# Patient Record
Sex: Male | Born: 1950
Health system: Southern US, Community
[De-identification: ages and names within clinical notes are randomized; demographics above are authoritative.]

## PROBLEM LIST (undated history)

## (undated) DIAGNOSIS — I10 Essential (primary) hypertension: Secondary | ICD-10-CM

## (undated) DIAGNOSIS — H9190 Unspecified hearing loss, unspecified ear: Secondary | ICD-10-CM

## (undated) HISTORY — PX: HERNIA REPAIR: SHX51

---

## 1998-04-11 ENCOUNTER — Emergency Department (HOSPITAL_COMMUNITY): Admission: EM | Admit: 1998-04-11 | Discharge: 1998-04-11 | Payer: Self-pay | Admitting: Emergency Medicine

## 1999-10-06 ENCOUNTER — Emergency Department (HOSPITAL_COMMUNITY): Admission: EM | Admit: 1999-10-06 | Discharge: 1999-10-06 | Payer: Self-pay | Admitting: Emergency Medicine

## 1999-11-11 ENCOUNTER — Emergency Department (HOSPITAL_COMMUNITY): Admission: EM | Admit: 1999-11-11 | Discharge: 1999-11-11 | Payer: Self-pay | Admitting: *Deleted

## 1999-11-11 ENCOUNTER — Encounter: Payer: Self-pay | Admitting: Emergency Medicine

## 2000-04-30 ENCOUNTER — Emergency Department (HOSPITAL_COMMUNITY): Admission: EM | Admit: 2000-04-30 | Discharge: 2000-04-30 | Payer: Self-pay | Admitting: Emergency Medicine

## 2000-09-21 ENCOUNTER — Encounter: Payer: Self-pay | Admitting: Emergency Medicine

## 2000-09-21 ENCOUNTER — Emergency Department (HOSPITAL_COMMUNITY): Admission: EM | Admit: 2000-09-21 | Discharge: 2000-09-21 | Payer: Self-pay | Admitting: Emergency Medicine

## 2000-10-15 ENCOUNTER — Emergency Department (HOSPITAL_COMMUNITY): Admission: EM | Admit: 2000-10-15 | Discharge: 2000-10-15 | Payer: Self-pay | Admitting: Emergency Medicine

## 2000-10-22 ENCOUNTER — Emergency Department (HOSPITAL_COMMUNITY): Admission: EM | Admit: 2000-10-22 | Discharge: 2000-10-22 | Payer: Self-pay | Admitting: *Deleted

## 2000-12-02 ENCOUNTER — Other Ambulatory Visit: Admission: RE | Admit: 2000-12-02 | Discharge: 2000-12-02 | Payer: Self-pay | Admitting: Otolaryngology

## 2000-12-02 ENCOUNTER — Encounter (INDEPENDENT_AMBULATORY_CARE_PROVIDER_SITE_OTHER): Payer: Self-pay | Admitting: Specialist

## 2001-12-19 ENCOUNTER — Emergency Department (HOSPITAL_COMMUNITY): Admission: EM | Admit: 2001-12-19 | Discharge: 2001-12-19 | Payer: Self-pay | Admitting: *Deleted

## 2002-08-18 ENCOUNTER — Emergency Department (HOSPITAL_COMMUNITY): Admission: EM | Admit: 2002-08-18 | Discharge: 2002-08-18 | Payer: Self-pay | Admitting: Emergency Medicine

## 2003-05-20 ENCOUNTER — Emergency Department (HOSPITAL_COMMUNITY): Admission: EM | Admit: 2003-05-20 | Discharge: 2003-05-20 | Payer: Self-pay | Admitting: Emergency Medicine

## 2004-07-10 ENCOUNTER — Encounter: Admission: RE | Admit: 2004-07-10 | Discharge: 2004-07-10 | Payer: Self-pay | Admitting: Occupational Medicine

## 2004-11-01 ENCOUNTER — Emergency Department (HOSPITAL_COMMUNITY): Admission: EM | Admit: 2004-11-01 | Discharge: 2004-11-01 | Payer: Self-pay | Admitting: Emergency Medicine

## 2004-11-13 ENCOUNTER — Ambulatory Visit (HOSPITAL_COMMUNITY): Admission: RE | Admit: 2004-11-13 | Discharge: 2004-11-13 | Payer: Self-pay | Admitting: Urology

## 2007-01-04 ENCOUNTER — Emergency Department (HOSPITAL_COMMUNITY): Admission: EM | Admit: 2007-01-04 | Discharge: 2007-01-04 | Payer: Self-pay | Admitting: Emergency Medicine

## 2007-03-10 ENCOUNTER — Encounter: Admission: RE | Admit: 2007-03-10 | Discharge: 2007-03-10 | Payer: Self-pay | Admitting: Occupational Medicine

## 2007-03-12 ENCOUNTER — Emergency Department (HOSPITAL_COMMUNITY): Admission: EM | Admit: 2007-03-12 | Discharge: 2007-03-12 | Payer: Self-pay | Admitting: Emergency Medicine

## 2008-07-24 ENCOUNTER — Emergency Department (HOSPITAL_COMMUNITY): Admission: EM | Admit: 2008-07-24 | Discharge: 2008-07-24 | Payer: Self-pay | Admitting: Emergency Medicine

## 2008-08-10 ENCOUNTER — Emergency Department (HOSPITAL_COMMUNITY): Admission: EM | Admit: 2008-08-10 | Discharge: 2008-08-10 | Payer: Self-pay | Admitting: Emergency Medicine

## 2008-08-20 ENCOUNTER — Emergency Department (HOSPITAL_COMMUNITY): Admission: EM | Admit: 2008-08-20 | Discharge: 2008-08-20 | Payer: Self-pay | Admitting: Emergency Medicine

## 2008-08-20 ENCOUNTER — Emergency Department (HOSPITAL_COMMUNITY): Admission: EM | Admit: 2008-08-20 | Discharge: 2008-08-20 | Payer: Self-pay | Admitting: Family Medicine

## 2008-09-19 ENCOUNTER — Ambulatory Visit (HOSPITAL_COMMUNITY): Admission: RE | Admit: 2008-09-19 | Discharge: 2008-09-19 | Payer: Self-pay | Admitting: General Surgery

## 2010-03-25 ENCOUNTER — Emergency Department (HOSPITAL_COMMUNITY)
Admission: EM | Admit: 2010-03-25 | Discharge: 2010-03-25 | Payer: Self-pay | Source: Home / Self Care | Admitting: Emergency Medicine

## 2010-03-26 ENCOUNTER — Emergency Department (HOSPITAL_COMMUNITY)
Admission: EM | Admit: 2010-03-26 | Discharge: 2010-03-26 | Payer: Self-pay | Source: Home / Self Care | Admitting: Family Medicine

## 2010-06-09 LAB — DIFFERENTIAL
Eosinophils Absolute: 0.1 10*3/uL (ref 0.0–0.7)
Eosinophils Relative: 1 % (ref 0–5)
Lymphocytes Relative: 9 % — ABNORMAL LOW (ref 12–46)
Lymphs Abs: 0.9 10*3/uL (ref 0.7–4.0)
Neutrophils Relative %: 80 % — ABNORMAL HIGH (ref 43–77)

## 2010-06-09 LAB — URINE CULTURE: Culture  Setup Time: 201112280115

## 2010-06-09 LAB — POCT I-STAT, CHEM 8
BUN: 24 mg/dL — ABNORMAL HIGH (ref 6–23)
Calcium, Ion: 1.07 mmol/L — ABNORMAL LOW (ref 1.12–1.32)
Chloride: 103 mEq/L (ref 96–112)
Creatinine, Ser: 1.2 mg/dL (ref 0.4–1.5)
Glucose, Bld: 109 mg/dL — ABNORMAL HIGH (ref 70–99)
HCT: 38 % — ABNORMAL LOW (ref 39.0–52.0)
Hemoglobin: 12.9 g/dL — ABNORMAL LOW (ref 13.0–17.0)
Potassium: 4.1 mEq/L (ref 3.5–5.1)

## 2010-06-09 LAB — URINALYSIS, ROUTINE W REFLEX MICROSCOPIC
Glucose, UA: NEGATIVE mg/dL
Leukocytes, UA: NEGATIVE
pH: 5.5 (ref 5.0–8.0)

## 2010-06-09 LAB — RAPID STREP SCREEN (MED CTR MEBANE ONLY): Streptococcus, Group A Screen (Direct): NEGATIVE

## 2010-06-09 LAB — URINE MICROSCOPIC-ADD ON

## 2010-06-09 LAB — CBC
MCH: 29.3 pg (ref 26.0–34.0)
MCHC: 33.5 g/dL (ref 30.0–36.0)
RDW: 13.3 % (ref 11.5–15.5)

## 2010-06-09 LAB — PROTIME-INR: INR: 1.15 (ref 0.00–1.49)

## 2010-08-12 NOTE — Op Note (Signed)
Bryce, Kane NO.:  [id number]   MEDICAL RECORD NO.:  [id number]          PATIENT TYPE:  AMB   LOCATION:  DAY                          FACILITY:  [facility]   PHYSICIAN:  Lennie Muckle, MD      DATE OF BIRTH:  [birth year]   DATE OF PROCEDURE:  09/19/2008  DATE OF DISCHARGE:  09/19/2008                               OPERATIVE REPORT   PREOPERATIVE DIAGNOSIS:  Right inguinal hernia.  Possible nondescended  testicle.   POSTOPERATIVE DIAGNOSIS:  Right inguinal hernia.   PROCEDURE:  Right inguinal hernia repair.   SURGEON:  Lennie Muckle, MD   ASSISTANT:  OR nurse.   ANESTHESIA:  General endotracheal anesthesia.   FINDINGS:  Thick hernia sac with an indirect hernia component.   SPECIMENS:  No specimens.   BLOOD LOSS:  50 mL.   COMPLICATIONS:  No immediate complications.   INDICATIONS FOR PROCEDURE:  Bryce Kane is a 60 year old male who had had  right groin pain and discomfort.  He has been having pain since early  May.  He had had a bulging.  He did have a worsening pain with standing  and movement.  Examination was consistent with a right inguinal hernia.  He was found to only have one testicle on examination.  He was tried on  ibuprofen and warm compresses, continued to have pain.  He therefore  obtained consent for a right inguinal hernia, possible orchiectomy given  the fact that the mass was palpated in the right groin which I could not  ascertain whether it was the testicle or the hernia.  Informed consent.  I had talked to him about the risk of the surgery including possible  orchiectomy.  He agreed to the procedure.   DETAILS OF PROCEDURE:  [name] was identified in the preoperative  holding area.  He received 2 g of Kefzol and was taken to the operating  room.  Once in the operating room, placed in supine position.  He  received a gram of Kefzol.  Once in the operating room, placed in supine  position.  After administration of general  endotracheal anesthesia, his  right groin was clipped, prepped and draped in the usual sterile  fashion.  A time-out procedure indicating the patient and procedure were  performed.  Using anatomic landmarks of the pubic tubercle and the right  anterior iliac spine, I placed an incision in the vicinity of the  ilioinguinal ligament.  Scarpa fascia was divided with electrocautery.  I came down to the external oblique muscle, the fascia.  I incised this  with 15 blade and extended the incision with Metzenbaum scissors.  I  carried the incision down to the external ring.  I opened the external  ring.  Continued superiorly to open up the external oblique fascia.  I  then dissected the hernia sac and spermatic cord and pubic tubercle.  A  Penrose drain was placed around this specimen.  I then dissected the  cremasteric muscle away from the hernia sac hernia and the spermatic  cord and vessels.  I was able  to dissect the hernia sac away from the  spermatic cord and vessels superiorly.  This was very dense and thick  and likely chronic in nature.  I dissected this far up into the internal  ring.  I performed a high ligation with a 2-0 silk suture.  This was  allowed to retract back into the abdominal cavity.  I then closed some  of the internal ring with a 2-0 Prolene suture.  Minimal amount of  oozing in this area.  I placed another stitch and the oozing was  controlled.  I then placed a 3 x 6 piece of Ultrapro mesh into the  operative field. Trimmed this down to size.  This was sewn with a 2-0  Prolene in a running fashion at the pubic tubercle at the shelving ledge  of the ilioinguinal ligament and at the transversalis and internal  oblique musculature.  I overlapped the tails around the spermatic cord  to create a new internal ring.  I tucked this under the external oblique  fascia.  I closed down the internal ring until only a pinky finger could  fit into the space between the spermatic cord  and the mesh.  I then  irrigated the wound bed.  There was no evidence of bleeding.  There was  a good closure of the mesh.  I then reapproximated the external oblique  fascia with a 3-0 Vicryl in a running fashion.  Scarpa's was  reapproximated.  The skin was closed with 4-0 Monocryl.  Steri-Strips  was placed as final dressing.  The patient was then extubated and  transported to post anesthesia care unit in stable condition. The  testicle was palpated in the scrotum.  He will be discharged home with  Percocet #50 to take over the counter stool softener of choice and  follow up with me in approximately 2 to 3 weeks.      Lennie Muckle, MD  Electronically Signed     ALA/MEDQ  D:  09/19/2008  T:  09/20/2008  Job:  [id number]

## 2011-05-14 ENCOUNTER — Emergency Department (HOSPITAL_COMMUNITY)
Admission: EM | Admit: 2011-05-14 | Discharge: 2011-05-14 | Disposition: A | Discharge status: Home / Self Care | Payer: BC Managed Care – PPO | Attending: Emergency Medicine | Admitting: Emergency Medicine

## 2011-05-14 DIAGNOSIS — K6289 Other specified diseases of anus and rectum: Secondary | ICD-10-CM | POA: Insufficient documentation

## 2011-05-14 DIAGNOSIS — L290 Pruritus ani: Secondary | ICD-10-CM | POA: Insufficient documentation

## 2011-05-14 DIAGNOSIS — K5900 Constipation, unspecified: Secondary | ICD-10-CM | POA: Insufficient documentation

## 2011-05-14 DIAGNOSIS — K644 Residual hemorrhoidal skin tags: Secondary | ICD-10-CM | POA: Insufficient documentation

## 2011-05-14 DIAGNOSIS — K648 Other hemorrhoids: Secondary | ICD-10-CM | POA: Insufficient documentation

## 2011-05-14 MED ORDER — DOCUSATE SODIUM 100 MG PO CAPS: 100.0000 mg | ORAL_CAPSULE | Freq: Two times a day (BID) | ORAL | Status: AC

## 2011-05-14 MED ORDER — POLYETHYLENE GLYCOL 3350 17 GM/SCOOP PO POWD: 17.0000 g | Freq: Every day | ORAL | Status: AC

## 2011-05-14 MED ORDER — HYDROCORTISONE ACETATE 25 MG RE SUPP: 25.0000 mg | Freq: Two times a day (BID) | RECTAL | Status: AC

## 2011-05-14 NOTE — Discharge Instructions (Signed)
Please read and follow all provided instructions.  Your diagnoses today include:  1. Hemorrhoids     Tests performed today include:  Vital signs. See below for your results today.   Medications prescribed:   Miralax - oral laxative  Colace - stool softener  Anusol - steroid to help with itching and pain  Take any prescribed medications only as directed. You have been prescribed pain medicine that can make you drowsy. Do not drive or perform any activities that require you to be awake and alert if taking this medication.   Follow-up instructions: Please follow-up with your primary care provider in the next 5 days for further evaluation of your symptoms. If you do not have a primary care doctor -- see below for referral information.   Return instructions:   Please return to the Emergency Department if you experience worsening symptoms.   Please return if you have any other concerns.  Additional Information:  Your vital signs today were: BP 152/93  Pulse 71  Temp(Src) 98.5 F (36.9 C) (Oral)  Resp 18  Wt 180 lb (81.647 kg)  SpO2 100% If your blood pressure (BP) was elevated above 135/85 this visit, please have this repeated by your doctor within one month. -------------- No Primary Care Doctor Call [facility]  [phone] Other agencies that provide inexpensive medical care    [facility]  [phone]    [facility]  [phone]    [facility]  [phone]    [facility]  [phone]    [facility]  [phone]    [facility]  [phone] -------------- RESOURCE GUIDE:  Dental Problems  Patients with Medicaid: [facility] [phone] [address]                                            [phone] [address] Phone:  [phone]                                                   Phone:  [phone]  If unable to pay or uninsured, contact:  [facility] or [facility]. to become qualified for the adult dental clinic.  Chronic Pain Problems Contact [facility]  [phone] Patients need to be referred by their primary care doctor.  Insufficient Money for Medicine Contact [employer]:  call "211" or [facility] [phone].  Psychological Services [facility]  [phone] [facility]  [phone] [facility]   [phone] (emergency services [phone])  Substance Abuse Resources [facility]  [phone] [facility] [phone] [facility] [place] [phone] Floydene Flock [phone] Residential & Outpatient Substance Abuse Program  [phone]  Abuse/Neglect [facility] Child Abuse Hotline [phone] [facility] Child Abuse Hotline [phone] (After Hours)  Emergency Shelter [facility] [phone]  Maternity Homes Room at the [place] [facility] [place] [phone] [facility] [phone]  [facility] [place]  [facility] [address] [place]                       [address]      [address]  [place]                                                [place]                            [place] Phone:  [phone]                                   Phone:  [phone]                 Phone:  [phone]  [facility] Phone:  [phone]  [place] Child Abuse Hotline [phone] (After Hours)    

## 2011-05-14 NOTE — ED Notes (Signed)
Pt alert, nad, c/o hemorrhoids, onset chronic in nature, states constipation, rectal itching after bowel movement, denies bloody stool, resp even unlabored, skin pwd

## 2011-05-14 NOTE — ED Provider Notes (Signed)
Medical screening examination/treatment/procedure(s) were performed by non-physician practitioner and as supervising physician I was immediately available for consultation/collaboration.  Amore Ackman K Lael Wetherbee-Rasch, MD 05/14/11 0741 

## 2011-05-14 NOTE — ED Notes (Addendum)
Pt reports having a hemorrhoid, chronic, itchy and pain at bowel movement. ABC intact. nad noted. Denied using any creams, sts the pain usually goes away on its own, denied any bleeding

## 2011-05-14 NOTE — ED Provider Notes (Signed)
History     CSN: [id number]  Arrival date & time 05/14/11  0447   First MD Initiated Contact with Patient 05/14/11 0602      Chief Complaint  Patient presents with  . Hemorrhoids    (Consider location/radiation/quality/duration/timing/severity/associated sxs/prior treatment) HPI Comments: Patient with history of hemorrhoids complaining of worsening pain with defecation and itching over the past several days. He has had previous episodes of similar symptoms "for a long time". Patient has had bright red blood noted in the toilet bowl in the past however this is not currently happening. Patient states that he has not had a bowel movement in several days. Patient states this is making his abdomen hurts. He denies nausea, vomiting, diarrhea. He denies urinary problems. He states that he has tried Preparation H in the past however this has not helped.  The history is provided by the patient.    No past medical history on file.  No past surgical history on file.  No family history on file.  History  Substance Use Topics  . Smoking status: Not on file  . Smokeless tobacco: Not on file  . Alcohol Use: Not on file      Review of Systems  Constitutional: Negative for fever.  HENT: Negative for sore throat and rhinorrhea.   Eyes: Negative for redness.  Respiratory: Negative for cough.   Cardiovascular: Negative for chest pain.  Gastrointestinal: Positive for abdominal pain, constipation and rectal pain. Negative for nausea, vomiting, diarrhea and blood in stool.  Genitourinary: Negative for dysuria.  Musculoskeletal: Negative for myalgias.  Skin: Negative for rash.  Neurological: Negative for headaches.    Allergies  Review of patient's allergies indicates no known allergies.  Home Medications  No current outpatient prescriptions on file.  BP 152/93  Pulse 71  Temp(Src) 98.5 F (36.9 C) (Oral)  Resp 18  Wt 180 lb (81.647 kg)  SpO2 100%  Physical Exam  Nursing note  and vitals reviewed. Constitutional: He is oriented to person, place, and time. He appears well-developed and well-nourished.  HENT:  Head: Normocephalic and atraumatic.  Eyes: Conjunctivae are normal. Right eye exhibits no discharge. Left eye exhibits no discharge.  Neck: Normal range of motion. Neck supple.  Cardiovascular: Normal rate, regular rhythm and normal heart sounds.   Pulmonary/Chest: Effort normal and breath sounds normal.  Abdominal: Soft. Bowel sounds are normal. There is no tenderness. There is no rebound and no guarding.  Genitourinary: Rectal exam shows external hemorrhoid and internal hemorrhoid. Rectal exam shows no mass, no tenderness and anal tone normal.  Neurological: He is alert and oriented to person, place, and time.  Skin: Skin is warm and dry.  Psychiatric: He has a normal mood and affect.    ED Course  Procedures (including critical care time)  Labs Reviewed - No data to display No results found.   1. Hemorrhoids   2. Pruritus ani     7:16 AM Patient seen and examined.   Vital signs reviewed and are as follows: Filed Vitals:   05/14/11 0652  BP: 139/63  Pulse: 63  Temp:   Resp: 16   Counseled on treatment for constipation, rectal pain, hemorrhoids. The patient was urged to return to the Emergency Department immediately with worsening of current symptoms, worsening abdominal pain, persistent vomiting, blood noted in stools, fever, or any other concerns. The patient verbalized understanding.   MDM  Patient with ext/int hemorrhoids on exam. Main complaint is constipation, rectal pain, and itching. No concerning abdominal  pain on exam. Abdomen is soft, NT. Vitals are stable, no fever. No signs of dehydration. No focal abdominal pain, no concern for appendicitis, cholecystitis, pancreatitis, ruptured viscus, UTI, kidney stone, or any other abdominal etiology.  Supportive therapy indicated with return if symptoms worsen.  Patient  counseled.         Eustace Moore [place], [place] 05/14/11 [phone]

## 2011-05-14 NOTE — ED Notes (Signed)
Pt HOH.

## 2014-10-30 ENCOUNTER — Emergency Department (HOSPITAL_COMMUNITY): Payer: Medicare Other

## 2014-10-30 ENCOUNTER — Emergency Department (HOSPITAL_COMMUNITY)
Admission: EM | Admit: 2014-10-30 | Discharge: 2014-10-31 | Disposition: A | Discharge status: Home / Self Care | Payer: Medicare Other | Attending: Emergency Medicine | Admitting: Emergency Medicine

## 2014-10-30 ENCOUNTER — Encounter (HOSPITAL_COMMUNITY): Payer: Self-pay | Admitting: *Deleted

## 2014-10-30 DIAGNOSIS — Z79899 Other long term (current) drug therapy: Secondary | ICD-10-CM | POA: Diagnosis not present

## 2014-10-30 DIAGNOSIS — R1013 Epigastric pain: Secondary | ICD-10-CM | POA: Diagnosis present

## 2014-10-30 DIAGNOSIS — Z9889 Other specified postprocedural states: Secondary | ICD-10-CM | POA: Diagnosis not present

## 2014-10-30 DIAGNOSIS — I10 Essential (primary) hypertension: Secondary | ICD-10-CM | POA: Insufficient documentation

## 2014-10-30 DIAGNOSIS — M545 Low back pain: Secondary | ICD-10-CM | POA: Insufficient documentation

## 2014-10-30 HISTORY — DX: Essential (primary) hypertension: I10

## 2014-10-30 LAB — COMPREHENSIVE METABOLIC PANEL
ALT: 22 U/L (ref 17–63)
ANION GAP: 6 (ref 5–15)
AST: 26 U/L (ref 15–41)
Albumin: 4 g/dL (ref 3.5–5.0)
Alkaline Phosphatase: 45 U/L (ref 38–126)
BUN: 19 mg/dL (ref 6–20)
CALCIUM: 8.9 mg/dL (ref 8.9–10.3)
CO2: 27 mmol/L (ref 22–32)
CREATININE: 1.12 mg/dL (ref 0.61–1.24)
Chloride: 109 mmol/L (ref 101–111)
GFR calc Af Amer: >60 mL/min (ref >60)
GFR calc non Af Amer: >60 mL/min (ref >60)
Glucose, Bld: 137 mg/dL — ABNORMAL HIGH (ref 65–99)
POTASSIUM: 3.3 mmol/L — AB (ref 3.5–5.1)
SODIUM: 142 mmol/L (ref 135–145)
Total Bilirubin: 0.6 mg/dL (ref 0.3–1.2)
Total Protein: 7.1 g/dL (ref 6.5–8.1)

## 2014-10-30 LAB — URINALYSIS, ROUTINE W REFLEX MICROSCOPIC
Bilirubin Urine: NEGATIVE
Glucose, UA: NEGATIVE mg/dL
HGB URINE DIPSTICK: NEGATIVE
KETONES UR: NEGATIVE mg/dL
LEUKOCYTES UA: NEGATIVE
Nitrite: NEGATIVE
PH: 5.5 (ref 5.0–8.0)
Protein, ur: NEGATIVE mg/dL
SPECIFIC GRAVITY, URINE: 1.025 (ref 1.005–1.030)
UROBILINOGEN UA: 0.2 mg/dL (ref 0.0–1.0)

## 2014-10-30 LAB — CBC
HEMATOCRIT: 38.9 % — AB (ref 39.0–52.0)
Hemoglobin: 12.8 g/dL — ABNORMAL LOW (ref 13.0–17.0)
MCH: 28.8 pg (ref 26.0–34.0)
MCHC: 32.9 g/dL (ref 30.0–36.0)
MCV: 87.6 fL (ref 78.0–100.0)
Platelets: 190 10*3/uL (ref 150–400)
RBC: 4.44 MIL/uL (ref 4.22–5.81)
RDW: 13.1 % (ref 11.5–15.5)
WBC: 5 10*3/uL (ref 4.0–10.5)

## 2014-10-30 LAB — LIPASE, BLOOD: Lipase: 21 U/L — ABNORMAL LOW (ref 22–51)

## 2014-10-30 MED ORDER — MORPHINE SULFATE 4 MG/ML IJ SOLN
4.0000 mg | Freq: Once | INTRAMUSCULAR | Status: AC
  Administered 2014-10-30: 4 mg via INTRAVENOUS
  Filled 2014-10-30: qty 1

## 2014-10-30 MED ORDER — ONDANSETRON HCL 4 MG/2ML IJ SOLN
4.0000 mg | Freq: Once | INTRAMUSCULAR | Status: AC
  Administered 2014-10-30: 4 mg via INTRAVENOUS
  Filled 2014-10-30: qty 2

## 2014-10-30 MED ORDER — SODIUM CHLORIDE 0.9 % IV BOLUS (SEPSIS)
1000.0000 mL | Freq: Once | INTRAVENOUS | Status: AC
  Administered 2014-10-30: 1000 mL via INTRAVENOUS

## 2014-10-30 MED ORDER — POTASSIUM CHLORIDE CRYS ER 20 MEQ PO TBCR
40.0000 meq | EXTENDED_RELEASE_TABLET | Freq: Once | ORAL | Status: AC
  Administered 2014-10-30: 40 meq via ORAL
  Filled 2014-10-30: qty 2

## 2014-10-30 MED ORDER — IOHEXOL 300 MG/ML  SOLN
100.0000 mL | Freq: Once | INTRAMUSCULAR | Status: AC | PRN
  Administered 2014-10-30: 100 mL via INTRAVENOUS

## 2014-10-30 NOTE — ED Notes (Signed)
Pt c/o middle and upper abdominal pain after eating x several weeks.  Pt denies nausea, vomiting, diarrhea.  Pt states he feels like he has "knot" in stomach after eating.  Pt also c/o lower back pain with difficulty bending over x 4days.

## 2014-10-30 NOTE — ED Notes (Signed)
PT states that he has had back pain since Fri that has gotten progressively worse; pt states that hurts to lean over or move; pt denies injury to back; pt also c/o abd pain; pt states that the abd pain in intermittent  And worse after he eats; pt states that it feels like a knot comes up in his stomach

## 2014-10-30 NOTE — ED Provider Notes (Signed)
CSN: [id number]     Arrival date & time 10/30/14  2008 History   First MD Initiated Contact with Patient 10/30/14 2122     Chief Complaint  Patient presents with  . Back Pain  . Abdominal Pain     (Consider location/radiation/quality/duration/timing/severity/associated sxs/prior Treatment) Patient is a 64 y.o. male presenting with abdominal pain. The history is provided by the patient and the spouse.  Abdominal Pain Pain location:  Epigastric Pain quality: sharp and shooting   Pain radiates to:  Does not radiate Pain severity:  Moderate Onset quality:  Sudden Duration:  2 days Timing:  Constant Progression:  Unchanged Chronicity:  New Relieved by:  Nothing Worsened by:  Nothing tried Ineffective treatments:  None tried Associated symptoms: no chest pain, no chills, no diarrhea, no fever, no nausea, no shortness of breath and no vomiting    64 yo M with a chief complaint of abdominal pain and back pain. This been going on for the past 4 days. Patient states the pain is much worse after he eats it's just above his umbilicus. Patient has got nauseated with it a couple times but has not vomited. Patient also is given complaining of lower back pain. Patient is unsure if this is related to his abdominal pain. Patient denies fevers or chills denies diarrhea.   Past Medical History  Diagnosis Date  . Hypertension    Past Surgical History  Procedure Laterality Date  . Hernia repair     No family history on file. History  Substance Use Topics  . Smoking status: Never Smoker   . Smokeless tobacco: Not on file  . Alcohol Use: No    Review of Systems  Constitutional: Negative for fever and chills.  HENT: Negative for congestion and facial swelling.   Eyes: Negative for discharge and visual disturbance.  Respiratory: Negative for shortness of breath.   Cardiovascular: Negative for chest pain and palpitations.  Gastrointestinal: Positive for abdominal pain. Negative for nausea,  vomiting and diarrhea.  Musculoskeletal: Positive for back pain. Negative for myalgias and arthralgias.  Skin: Negative for color change and rash.  Neurological: Negative for tremors, syncope and headaches.  Psychiatric/Behavioral: Negative for confusion and dysphoric mood.      Allergies  Review of patient's allergies indicates no known allergies.  Home Medications   Prior to Admission medications   Medication Sig Start Date End Date Taking? Authorizing Provider  ibuprofen (ADVIL,MOTRIN) 200 MG tablet Take 200 mg by mouth every 6 (six) hours as needed for moderate pain.   Yes Historical Provider, MD  valsartan-hydrochlorothiazide (DIOVAN-HCT) 320-12.5 MG per tablet Take 1 tablet by mouth every morning. 10/05/14  Yes Historical Provider, MD   BP 131/93 mmHg  Pulse 94  Temp(Src) 98.2 F (36.8 C) (Oral)  Resp 18  Wt 175 lb (79.379 kg)  SpO2 99% Physical Exam  Constitutional: He is oriented to person, place, and time. He appears well-developed and well-nourished.  HENT:  Head: Normocephalic and atraumatic.  Eyes: EOM are normal. Pupils are equal, round, and reactive to light.  Neck: Normal range of motion. Neck supple. No JVD present.  Cardiovascular: Normal rate and regular rhythm.  Exam reveals no gallop and no friction rub.   No murmur heard. Pulmonary/Chest: No respiratory distress. He has no wheezes.  Abdominal: He exhibits no distension. There is no tenderness. There is no rebound and no guarding.  Benign abdominal exam.  Musculoskeletal: Normal range of motion. He exhibits no edema or tenderness.  Patient points to  levels of iliac crest across his back. States that it hurts in that area however no pain on palpation.  Neurological: He is alert and oriented to person, place, and time. He displays no Babinski's sign on the right side. He displays no Babinski's sign on the left side.  Reflex Scores:      Patellar reflexes are 2+ on the right side and 2+ on the left side.       Achilles reflexes are 2+ on the right side and 2+ on the left side. Ambulates without difficulty.  Skin: No rash noted. No pallor.  Psychiatric: He has a normal mood and affect. His behavior is normal.    ED Course  Procedures (including critical care time) Labs Review Labs Reviewed  CBC - Abnormal; Notable for the following:    Hemoglobin 12.8 (*)    HCT 38.9 (*)    All other components within normal limits  LIPASE, BLOOD  COMPREHENSIVE METABOLIC PANEL  URINALYSIS, ROUTINE W REFLEX MICROSCOPIC (NOT AT [facility])    Imaging Review No results found.   EKG Interpretation None      MDM   Final diagnoses:  None    65 yo M with a chief complaint of abdominal pain and back pain. Benign abdominal and back exam. Patient says the pain is colicky. Will obtain a CT scan of the abdomen and pelvis to eval for AAA, vs bowel pathology.  CT scan negative and images viewed by me. UA negative for infection. Patient with very mild hypokalemia. Treated here in the ED.  12:24 AM:  I have discussed the diagnosis/risks/treatment options with the patient and family and believe the pt to be eligible for discharge home to follow-up with PCP. We also discussed returning to the ED immediately if new or worsening sx occur. We discussed the sx which are most concerning (e.g., sudden worsening pain.) that necessitate immediate return. Medications administered to the patient during their visit and any new prescriptions provided to the patient are listed below.  Medications given during this visit Medications  morphine 4 MG/ML injection 4 mg (4 mg Intravenous Given 10/30/14 2308)  ondansetron (ZOFRAN) injection 4 mg (4 mg Intravenous Given 10/30/14 2308)  sodium chloride 0.9 % bolus 1,000 mL (1,000 mLs Intravenous New Bag/Given 10/30/14 2307)  potassium chloride SA (K-DUR,KLOR-CON) CR tablet 40 mEq (40 mEq Oral Given 10/30/14 2307)  iohexol (OMNIPAQUE) 300 MG/ML solution 100 mL (100 mLs Intravenous Contrast Given  10/30/14 2330)    New Prescriptions   No medications on file     The patient appears reasonably screen and/or stabilized for discharge and I doubt any other medical condition or other [facility] requiring further screening, evaluation, or treatment in the ED at this time prior to discharge.     Melene Plan, DO 10/31/14 0025

## 2014-10-31 NOTE — Discharge Instructions (Signed)

## 2016-11-22 ENCOUNTER — Emergency Department (HOSPITAL_COMMUNITY): Payer: Medicare Other

## 2016-11-22 ENCOUNTER — Encounter (HOSPITAL_COMMUNITY): Payer: Self-pay | Admitting: Emergency Medicine

## 2016-11-22 ENCOUNTER — Emergency Department (HOSPITAL_COMMUNITY)
Admission: EM | Admit: 2016-11-22 | Discharge: 2016-11-22 | Disposition: A | Discharge status: Home / Self Care | Payer: Medicare Other | Attending: Emergency Medicine | Admitting: Emergency Medicine

## 2016-11-22 DIAGNOSIS — Z79899 Other long term (current) drug therapy: Secondary | ICD-10-CM | POA: Diagnosis not present

## 2016-11-22 DIAGNOSIS — S66912A Strain of unspecified muscle, fascia and tendon at wrist and hand level, left hand, initial encounter: Secondary | ICD-10-CM | POA: Diagnosis not present

## 2016-11-22 DIAGNOSIS — M25542 Pain in joints of left hand: Secondary | ICD-10-CM | POA: Diagnosis present

## 2016-11-22 DIAGNOSIS — X503XXA Overexertion from repetitive movements, initial encounter: Secondary | ICD-10-CM | POA: Diagnosis not present

## 2016-11-22 DIAGNOSIS — Y939 Activity, unspecified: Secondary | ICD-10-CM | POA: Insufficient documentation

## 2016-11-22 DIAGNOSIS — M1812 Unilateral primary osteoarthritis of first carpometacarpal joint, left hand: Secondary | ICD-10-CM | POA: Insufficient documentation

## 2016-11-22 DIAGNOSIS — Y929 Unspecified place or not applicable: Secondary | ICD-10-CM | POA: Insufficient documentation

## 2016-11-22 DIAGNOSIS — Y999 Unspecified external cause status: Secondary | ICD-10-CM | POA: Insufficient documentation

## 2016-11-22 DIAGNOSIS — M79642 Pain in left hand: Secondary | ICD-10-CM

## 2016-11-22 DIAGNOSIS — T148XXA Other injury of unspecified body region, initial encounter: Secondary | ICD-10-CM

## 2016-11-22 MED ORDER — ACETAMINOPHEN 325 MG PO TABS
650.0000 mg | ORAL_TABLET | Freq: Once | ORAL | Status: AC
  Administered 2016-11-22: 650 mg via ORAL
  Filled 2016-11-22: qty 2

## 2016-11-22 NOTE — ED Triage Notes (Signed)
Pt c/o chronic L hand pain. Pt states pain in L hand and in L knuckles between 2nd and 3rd fingers. 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

## 2016-11-22 NOTE — ED Provider Notes (Signed)
[facility]-EMERGENCY DEPT Provider Note   CSN: [id number] Arrival date & time: 11/22/16  1301     History   Chief Complaint Chief Complaint  Patient presents with  . Hand Pain    HPI Bryce Kane is a 66 y.o. male with a PMHx of HTN, who presents to the ED with complaints of chronic left hand pain. Patient states that he has worked in a paper company for 31 years, and does a lot of heavy lifting at work. States that he always has bilateral thumb pain because of this heavy lifting, however last week he started having some left hand pain second-third MCP joint area. He describes the pain as 6/10 at onset although not currently ongoing, and it's intermittent nonradiating sharp and sore left hand pain that usually occurs when he is working or using his hands, with no tx tried PTA and no known alleviating factors aside from not using his hand. He noticed some swelling as well. He denies any bruising, erythema, warmth, fever, numbness, tingling, focal weakness, or any other complaints at this time. He has never seen anybody about his hand pain.   The history is provided by the patient and medical records. No language interpreter was used.  Hand Pain  This is a chronic problem. The current episode started more than 2 days ago. The problem occurs daily. The problem has not changed since onset.Exacerbated by: use of hands. Nothing relieves the symptoms. He has tried nothing for the symptoms. The treatment provided no relief.    Past Medical History:  Diagnosis Date  . Hypertension     There are no active problems to display for this patient.   Past Surgical History:  Procedure Laterality Date  . HERNIA REPAIR         Home Medications    Prior to Admission medications   Medication Sig Start Date End Date Taking? Authorizing Provider  ibuprofen (ADVIL,MOTRIN) 200 MG tablet Take 200 mg by mouth every 6 (six) hours as needed for moderate pain.    [provider]    valsartan-hydrochlorothiazide (DIOVAN-HCT) 320-12.5 MG per tablet Take 1 tablet by mouth every morning. 10/05/14   [provider]    Family History No family history on file.  Social History Social History  Substance Use Topics  . Smoking status: Never Smoker  . Smokeless tobacco: Not on file  . Alcohol use No     Allergies   Patient has no known allergies.   Review of Systems Review of Systems  Constitutional: Negative for chills and fever.  Musculoskeletal: Positive for arthralgias and joint swelling.  Skin: Negative for color change.  Allergic/Immunologic: Negative for immunocompromised state.  Neurological: Negative for weakness and numbness.  Psychiatric/Behavioral: Negative for confusion.     Physical Exam Updated Vital Signs BP (!) 145/93 (BP Location: Right Arm)   Pulse 93   Temp 98.7 F (37.1 C) (Oral)   Resp 18   SpO2 99%   Physical Exam  Constitutional: He is oriented to person, place, and time. Vital signs are normal. He appears well-developed and well-nourished.  Non-toxic appearance. No distress.  Afebrile, nontoxic, NAD  HENT:  Head: Normocephalic and atraumatic.  Mouth/Throat: Mucous membranes are normal.  Eyes: Conjunctivae and EOM are normal. Right eye exhibits no discharge. Left eye exhibits no discharge.  Neck: Normal range of motion. Neck supple.  Cardiovascular: Normal rate and intact distal pulses.   Pulmonary/Chest: Effort normal. No respiratory distress.  Abdominal: Normal appearance. He exhibits no distension.  Musculoskeletal: Normal range of motion.       Left hand: He exhibits tenderness and swelling. He exhibits normal range of motion, normal capillary refill, no deformity and no laceration. Normal sensation noted. Normal strength noted.       Hands: L hand with FROM intact in all digits, with very mild swelling over the 2nd/3rd MCP joint area and the space between the joints, no erythema or warmth, no bruising, mild TTP to  this area as well as to the 1st [facility] joint, no crepitus or deformities, Strength and sensation grossly intact, distal pulses intact, compartments soft.   Neurological: He is alert and oriented to person, place, and time. He has normal strength. No sensory deficit.  Skin: Skin is warm, dry and intact. No rash noted.  Psychiatric: He has a normal mood and affect.  Nursing note and vitals reviewed.    ED Treatments / Results  Labs (all labs ordered are listed, but only abnormal results are displayed) Labs Reviewed - No data to display  EKG  EKG Interpretation None       Radiology Dg Hand Complete Left  Result Date: 11/22/2016 CLINICAL DATA:  Left hand pain, no known injury, initial encounter EXAM: LEFT HAND - COMPLETE 3+ VIEW COMPARISON:  None. FINDINGS: Mild degenerative changes are noted at the first [facility] joint. No acute fracture or dislocation is noted. No soft tissue abnormality is seen. IMPRESSION: No acute abnormality noted. Electronically Signed   By: Alcide Clever M.D.   On: 11/22/2016 13:52    Procedures Procedures (including critical care time)  Medications Ordered in ED Medications  acetaminophen (TYLENOL) tablet 650 mg (650 mg Oral Given 11/22/16 1410)     Initial Impression / Assessment and Plan / ED Course  I have reviewed the triage vital signs and the nursing notes.  Pertinent labs & imaging results that were available during my care of the patient were reviewed by me and considered in my medical decision making (see chart for details).     66 y.o. male here with L hand pain, somewhat chronic in nature however last week started having pain in the 2nd/3rd MCP joint area. No trauma or injury. On exam, mild swelling overlying the 2nd/3rd MCP area, mild TTP to this area, no crepitus or deformity, no overlying skin changes, no warmth, NVI with soft compartments, FROM intact. Will give tylenol, xray already in process which is fine since this will give [place] a chance to see  if he has a stress fx or arthritis in the joint. Will reassess shortly.   2:30 PM Xray negative aside from some mild degenerative changes at 1st [facility] joint. Likely overuse tendinitis/strain compounded by arthritis. Ace wrap applied for comfort/compression. Advised RICE, tylenol/motrin for pain, and f/up with hand specialist in 1-2wks for recheck of symptoms. I explained the diagnosis and have given explicit precautions to return to the ER including for any other new or worsening symptoms. The patient understands and accepts the medical plan as it's been dictated and I have answered their questions. Discharge instructions concerning home care and prescriptions have been given. The patient is STABLE and is discharged to home in good condition.    Final Clinical Impressions(s) / ED Diagnoses   Final diagnoses:  Left hand pain  Repetitive strain injury  Osteoarthritis of carpometacarpal joint of left thumb, unspecified osteoarthritis type    New Prescriptions New Prescriptions   No medications on [address], [place], [place] 11/22/16 1431  Marily Memos, MD 11/23/16 1034

## 2016-11-22 NOTE — Discharge Instructions (Signed)
Wear ace wrap as needed for comfort. Ice and elevate hand throughout the day, using ice pack for no more than 20 minutes every hour.  Alternate between tylenol and motrin as needed for pain relief. Call hand specialist follow up today or tomorrow to schedule followup appointment for recheck of symptoms in 1-2 weeks. Return to the ER for changes or worsening symptoms.

## 2017-12-28 ENCOUNTER — Other Ambulatory Visit: Payer: Self-pay

## 2017-12-28 ENCOUNTER — Emergency Department (HOSPITAL_COMMUNITY)
Admission: EM | Admit: 2017-12-28 | Discharge: 2017-12-28 | Disposition: A | Discharge status: Home / Self Care | Payer: Medicare Other | Attending: Emergency Medicine | Admitting: Emergency Medicine

## 2017-12-28 ENCOUNTER — Encounter (HOSPITAL_COMMUNITY): Payer: Self-pay

## 2017-12-28 DIAGNOSIS — Z79899 Other long term (current) drug therapy: Secondary | ICD-10-CM | POA: Insufficient documentation

## 2017-12-28 DIAGNOSIS — I10 Essential (primary) hypertension: Secondary | ICD-10-CM

## 2017-12-28 DIAGNOSIS — Z760 Encounter for issue of repeat prescription: Secondary | ICD-10-CM | POA: Diagnosis not present

## 2017-12-28 LAB — BASIC METABOLIC PANEL
Anion gap: 9 (ref 5–15)
BUN: 17 mg/dL (ref 8–23)
CALCIUM: 9.3 mg/dL (ref 8.9–10.3)
CO2: 27 mmol/L (ref 22–32)
CREATININE: 1.02 mg/dL (ref 0.61–1.24)
Chloride: 110 mmol/L (ref 98–111)
GFR calc non Af Amer: >60 mL/min (ref >60)
Glucose, Bld: 103 mg/dL — ABNORMAL HIGH (ref 70–99)
Potassium: 4.2 mmol/L (ref 3.5–5.1)
SODIUM: 146 mmol/L — AB (ref 135–145)

## 2017-12-28 MED ORDER — VALSARTAN-HYDROCHLOROTHIAZIDE 320-12.5 MG PO TABS: 1.0000 | ORAL_TABLET | Freq: Every morning | ORAL | 12 refills | Status: DC

## 2017-12-28 NOTE — Discharge Instructions (Addendum)
It is important to establish care with a primary care provider for further management of your blood pressure.  From given for a primary care provider today while in the ER, contact office tomorrow and begin to work on plan for follow-up arrangements.

## 2017-12-28 NOTE — ED Triage Notes (Signed)
Patient reports that his "Doctor went out of business." and he ran out of Valsartan-HCTZ 320/12.5. Patient states he skipped doses so he could make them last.

## 2017-12-28 NOTE — ED Provider Notes (Signed)
[facility]-EMERGENCY DEPT Provider Note   CSN: [id number] Arrival date & time: 12/28/17  1800     History   Chief Complaint Chief Complaint  Patient presents with  . Medication Refill    HPI Bryce Kane is a 67 y.o. male.  67 year old male presents with request for refill of his blood pressure medication.  Patient states that his doctor retired or went out of business and he is been unable to be seen for a refill.  Patient has a bottle of his blood pressure medication with him, last filled on April 2018, patient states that this is an older bottle, he had other bottles at home but he is Artie taking all the medication.  Patient has 1 pill left in the bottle, states that he has been taking it in half as it is a large pill to try and swallow.  Patient denies complaints of vision changes, headache, chest pain.  No other complaints or concerns today.     Past Medical History:  Diagnosis Date  . Hypertension     There are no active problems to display for this patient.   Past Surgical History:  Procedure Laterality Date  . HERNIA REPAIR          Home Medications    Prior to Admission medications   Medication Sig Start Date End Date Taking? Authorizing Provider  ibuprofen (ADVIL,MOTRIN) 200 MG tablet Take 200 mg by mouth every 6 (six) hours as needed for moderate pain.    [provider]  valsartan-hydrochlorothiazide (DIOVAN-HCT) 320-12.5 MG tablet Take 1 tablet by mouth every morning. 12/28/17   Jeannie Fend, PA-C    Family History History reviewed. No pertinent family history.  Social History Social History   Tobacco Use  . Smoking status: Never Smoker  . Smokeless tobacco: Never Used  Substance Use Topics  . Alcohol use: No  . Drug use: No     Allergies   Patient has no known allergies.   Review of Systems Review of Systems  Eyes: Negative for visual disturbance.  Respiratory: Negative for chest tightness and  shortness of breath.   Cardiovascular: Negative for chest pain and leg swelling.  Gastrointestinal: Negative for nausea and vomiting.  Allergic/Immunologic: Negative for immunocompromised state.  Neurological: Negative for dizziness and headaches.  Psychiatric/Behavioral: Negative for confusion.  All other systems reviewed and are negative.    Physical Exam Updated Vital Signs BP (!) 159/108   Pulse 65   Temp 99.2 F (37.3 C) (Oral)   Resp 16   Ht 5\' 10"  (1.778 m)   Wt 90.7 kg   SpO2 100%   BMI 28.70 kg/m   Physical Exam  Constitutional: He is oriented to person, place, and time. He appears well-developed and well-nourished. No distress.  HENT:  Head: Normocephalic and atraumatic.  Cardiovascular: Normal rate, regular rhythm, normal heart sounds and intact distal pulses.  No murmur heard. Pulmonary/Chest: Effort normal and breath sounds normal. No respiratory distress.  Abdominal: Soft. He exhibits no distension. There is no tenderness.  Neurological: He is alert and oriented to person, place, and time.  Skin: Skin is warm and dry. He is not diaphoretic.  Psychiatric: He has a normal mood and affect. His behavior is normal.  Nursing note and vitals reviewed.    ED Treatments / Results  Labs (all labs ordered are listed, but only abnormal results are displayed) Labs Reviewed  BASIC METABOLIC PANEL - Abnormal; Notable for the following components:  Result Value   Sodium 146 (*)    Glucose, Bld 103 (*)    All other components within normal limits    EKG None  Radiology No results found.  Procedures Procedures (including critical care time)  Medications Ordered in ED Medications - No data to display   Initial Impression / Assessment and Plan / ED Course  I have reviewed the triage vital signs and the nursing notes.  Pertinent labs & imaging results that were available during my care of the patient were reviewed by me and considered in my medical  decision making (see chart for details).  Clinical Course as of Dec 29 1950  Tue Dec 28, 2017  2684 67 year old male with history of high blood pressure presents with request for refill of his blood pressure medication, denies any other complaints or concerns today.  Patient does not have any recent blood work on file, plan is to check a BMP to evaluate renal function.  If lab work is unremarkable patient may be discharged with refill for 2 weeks worth of his medication with referral to local PCP for follow-up.  Patient verbalizes understanding of discharge instructions and plan.   [LM]    Clinical Course User Index [LM] Jeannie Fend, PA-C   Final Clinical Impressions(s) / ED Diagnoses   Final diagnoses:  Medication refill  Hypertension, unspecified type    ED Discharge Orders         Ordered    valsartan-hydrochlorothiazide (DIOVAN-HCT) 320-12.5 MG tablet   Every morning - 10a     12/28/17 1950           Jeannie Fend, PA-C 12/28/17 1952    Tilden Fossa, MD 12/30/17 [phone]

## 2018-02-03 ENCOUNTER — Encounter (INDEPENDENT_AMBULATORY_CARE_PROVIDER_SITE_OTHER): Payer: Self-pay | Admitting: Physician Assistant

## 2018-02-03 ENCOUNTER — Ambulatory Visit (INDEPENDENT_AMBULATORY_CARE_PROVIDER_SITE_OTHER): Payer: Medicare Other | Admitting: Physician Assistant

## 2018-02-03 VITALS — BP 145/89 | HR 79 | Temp 97.9°F | Resp 18 | Ht 70.0 in | Wt 194.0 lb

## 2018-02-03 DIAGNOSIS — G8929 Other chronic pain: Secondary | ICD-10-CM

## 2018-02-03 DIAGNOSIS — Z1159 Encounter for screening for other viral diseases: Secondary | ICD-10-CM | POA: Diagnosis not present

## 2018-02-03 DIAGNOSIS — I10 Essential (primary) hypertension: Secondary | ICD-10-CM | POA: Diagnosis not present

## 2018-02-03 DIAGNOSIS — M25512 Pain in left shoulder: Secondary | ICD-10-CM | POA: Diagnosis not present

## 2018-02-03 DIAGNOSIS — H9193 Unspecified hearing loss, bilateral: Secondary | ICD-10-CM

## 2018-02-03 DIAGNOSIS — Z1211 Encounter for screening for malignant neoplasm of colon: Secondary | ICD-10-CM

## 2018-02-03 MED ORDER — NAPROXEN 500 MG PO TABS: 500.0000 mg | ORAL_TABLET | Freq: Two times a day (BID) | ORAL | 0 refills | Status: DC

## 2018-02-03 MED ORDER — VALSARTAN-HYDROCHLOROTHIAZIDE 320-12.5 MG PO TABS: 1.0000 | ORAL_TABLET | Freq: Every morning | ORAL | 11 refills | Status: DC

## 2018-02-03 MED ORDER — VALSARTAN-HYDROCHLOROTHIAZIDE 320-12.5 MG PO TABS: 1.0000 | ORAL_TABLET | Freq: Every morning | ORAL | 12 refills | Status: DC

## 2018-02-03 MED ORDER — AMLODIPINE BESYLATE 5 MG PO TABS: 5.0000 mg | ORAL_TABLET | Freq: Every day | ORAL | 3 refills | Status: DC

## 2018-02-03 NOTE — Progress Notes (Signed)
Subjective:  Patient ID: Bryce Kane, male    DOB: 11/24/1950  Age: 67 y.o. MRN: [id number]  CC: HTN   HPI Skylier Kretschmer is a 67 y.o. male with a medical history of HTN, hemorrhoids, and epigastric pain presents as a new pt for management of HTN. Has been lost to f/u at his previous PCP. Has been taking Diovan-HCT at night time. Prescribed Diovan-Hct from ED visit on 12/28/2017. BP 145/89 mmHg today. Took last pill of Diovan-Hct last night and needs a refill. Does not endorse CP, palpitations, SOB, HA, tingling, numbness, abdominal pain, f/c/n/v, swelling, or GI/GU sxs.       Only complaint is of chronic left shoulder pain attributed to arthritis. Has taken NSAIDs before with temporary relief. Does not endorse limited aROM, paresthesias, or loss of strength. No hx of imaging, physical therapy, or orthopedic evaluation.      Outpatient Medications Prior to Visit  Medication Sig Dispense Refill  . ibuprofen (ADVIL,MOTRIN) 200 MG tablet Take 200 mg by mouth every 6 (six) hours as needed for moderate pain.    . valsartan-hydrochlorothiazide (DIOVAN-HCT) 320-12.5 MG tablet Take 1 tablet by mouth every morning. 14 tablet 12   No facility-administered medications prior to visit.      ROS Review of Systems  Constitutional: Negative for chills, fever and malaise/fatigue.  Eyes: Negative for blurred vision.  Respiratory: Negative for shortness of breath.   Cardiovascular: Negative for chest pain and palpitations.  Gastrointestinal: Negative for abdominal pain and nausea.  Genitourinary: Negative for dysuria and hematuria.  Musculoskeletal: Positive for joint pain (left shoulder). Negative for myalgias.  Skin: Negative for rash.  Neurological: Negative for tingling and headaches.       Hard of hearing  Psychiatric/Behavioral: Negative for depression. The patient is not nervous/anxious.     Objective:  BP (!) 145/89 (BP Location: Right Arm, Patient Position: Sitting, Cuff Size: Normal)    Pulse 79   Temp 97.9 F (36.6 C) (Oral)   Resp 18   Ht 5\' 10"  (1.778 m)   Wt 194 lb (88 kg)   SpO2 98%   BMI 27.84 kg/m   BP/Weight 02/03/2018 12/28/2017 11/22/2016  Systolic BP 145 161 145  Diastolic BP 89 94 93  Wt. (Lbs) 194 200 -  BMI 27.84 28.7 -      Physical Exam  Constitutional: He is oriented to person, place, and time.  Well developed, well nourished, NAD, polite  HENT:  Head: Normocephalic and atraumatic.  Eyes: No scleral icterus.  Neck: Normal range of motion. Neck supple. No thyromegaly present.  Cardiovascular: Normal rate, regular rhythm and normal heart sounds.  No LE edema bilaterally  Pulmonary/Chest: Effort normal and breath sounds normal.  Musculoskeletal: He exhibits no edema.  Right shoulder with full aROM. Pain elicited on Napolean's and internal/external stress testing.  Neurological: He is alert and oriented to person, place, and time.  Hard of hearing to conversational tones bilaterally  Skin: Skin is warm and dry. No rash noted. No erythema. No pallor.  Psychiatric: He has a normal mood and affect. His behavior is normal. Thought content normal.  Vitals reviewed.    Assessment & Plan:   1. Hypertension, unspecified type - amLODipine (NORVASC) 5 MG tablet; Take 1 tablet (5 mg total) by mouth daily.  Dispense: 90 tablet; Refill: 3 - Lipid panel - TSH - valsartan-hydrochlorothiazide (DIOVAN-HCT) 320-12.5 MG tablet; Take 1 tablet by mouth every morning.  Dispense: 30 tablet; Refill: 11  2. Chronic left shoulder  pain - DG Shoulder Left; Future - naproxen (NAPROSYN) 500 MG tablet; Take 1 tablet (500 mg total) by mouth 2 (two) times daily with a meal.  Dispense: 30 tablet; Refill: 0  3. Bilateral hearing loss, unspecified hearing loss type - Ambulatory referral to Audiology  4. Need for hepatitis C screening test - Hepatitis c antibody (reflex)  5. Special screening for malignant neoplasms, colon - Fecal occult blood,  imunochemical   Meds ordered this encounter  Medications  . DISCONTD: valsartan-hydrochlorothiazide (DIOVAN-HCT) 320-12.5 MG tablet    Sig: Take 1 tablet by mouth every morning.    Dispense:  14 tablet    Refill:  12    Order Specific Question:   Supervising Provider    Answer:   Hoy Register [[id number]]  . naproxen (NAPROSYN) 500 MG tablet    Sig: Take 1 tablet (500 mg total) by mouth 2 (two) times daily with a meal.    Dispense:  30 tablet    Refill:  0    Order Specific Question:   Supervising Provider    Answer:   Hoy Register [[id number]]  . amLODipine (NORVASC) 5 MG tablet    Sig: Take 1 tablet (5 mg total) by mouth daily.    Dispense:  90 tablet    Refill:  3    Order Specific Question:   Supervising Provider    Answer:   Hoy Register [[id number]]  . valsartan-hydrochlorothiazide (DIOVAN-HCT) 320-12.5 MG tablet    Sig: Take 1 tablet by mouth every morning.    Dispense:  30 tablet    Refill:  11    Order Specific Question:   Supervising Provider    Answer:   Hoy Register [[id number]]    Follow-up: Return in about 4 weeks (around 03/03/2018) for HTN.   Loletta Specter PA

## 2018-02-03 NOTE — Patient Instructions (Signed)

## 2018-02-04 ENCOUNTER — Telehealth (INDEPENDENT_AMBULATORY_CARE_PROVIDER_SITE_OTHER): Payer: Self-pay

## 2018-02-04 LAB — LIPID PANEL
Chol/HDL Ratio: 3.7 ratio (ref 0.0–5.0)
Cholesterol, Total: 134 mg/dL (ref 100–199)
HDL: 36 mg/dL — ABNORMAL LOW (ref >39)
LDL Calculated: 81 mg/dL (ref 0–99)
TRIGLYCERIDES: 86 mg/dL (ref 0–149)
VLDL Cholesterol Cal: 17 mg/dL (ref 5–40)

## 2018-02-04 LAB — HEPATITIS C ANTIBODY (REFLEX): HCV Ab: <0.1 s/co ratio (ref 0.0–0.9)

## 2018-02-04 LAB — TSH: TSH: 0.976 u[IU]/mL (ref 0.450–4.500)

## 2018-02-04 LAB — HCV COMMENT:

## 2018-02-04 NOTE — Telephone Encounter (Signed)
Patient was unavailable, wife informed that all patient lab results are normal. She will notify patient. Maryjean Morn, CMA

## 2018-02-04 NOTE — Telephone Encounter (Signed)
-----   Message from Loletta Specter, PA-C sent at 02/04/2018 12:48 PM EST ----- Results normal.

## 2018-03-03 ENCOUNTER — Ambulatory Visit (INDEPENDENT_AMBULATORY_CARE_PROVIDER_SITE_OTHER): Payer: Medicare Other | Admitting: Physician Assistant

## 2018-05-24 ENCOUNTER — Ambulatory Visit (INDEPENDENT_AMBULATORY_CARE_PROVIDER_SITE_OTHER): Payer: Medicare Other | Admitting: Primary Care

## 2018-05-24 ENCOUNTER — Encounter (INDEPENDENT_AMBULATORY_CARE_PROVIDER_SITE_OTHER): Payer: Self-pay | Admitting: Primary Care

## 2018-05-24 ENCOUNTER — Other Ambulatory Visit: Payer: Self-pay

## 2018-05-24 VITALS — BP 147/103 | HR 84 | Temp 97.6°F | Ht 70.0 in | Wt 202.0 lb

## 2018-05-24 DIAGNOSIS — Z713 Dietary counseling and surveillance: Secondary | ICD-10-CM

## 2018-05-24 DIAGNOSIS — I10 Essential (primary) hypertension: Secondary | ICD-10-CM

## 2018-05-24 DIAGNOSIS — H9193 Unspecified hearing loss, bilateral: Secondary | ICD-10-CM

## 2018-05-24 MED ORDER — AMLODIPINE BESYLATE 5 MG PO TABS: 5.0000 mg | ORAL_TABLET | Freq: Every day | ORAL | 3 refills | Status: DC

## 2018-05-24 MED ORDER — VALSARTAN-HYDROCHLOROTHIAZIDE 320-12.5 MG PO TABS: 1.0000 | ORAL_TABLET | Freq: Every morning | ORAL | 11 refills | Status: DC

## 2018-05-24 NOTE — Progress Notes (Signed)
Established Patient Office Visit  Subjective:  Patient ID: Bryce Kane, male    DOB: 1950/11/05  Age: 68 y.o. MRN: [id number]  CC:  Chief Complaint  Patient presents with  . Follow-up    HTN    HPI Bryce Kane presents for follow up on HTN. He has a medical history of  hemorrhoids, and epigastric pain. Patient has not been taking medications Bp as prescribed he was not understanding that both pills were 3 pills one being a combo. He will rtn in 2 weeks for Bp ck only.          Past Medical History:  Diagnosis Date  . Hypertension     Past Surgical History:  Procedure Laterality Date  . HERNIA REPAIR      No family history on file.  Social History   Socioeconomic History  . Marital status: Married    Spouse name: Not on file  . Number of children: Not on file  . Years of education: Not on file  . Highest education level: Not on file  Occupational History  . Not on file  Social Needs  . Financial resource strain: Not on file  . Food insecurity:    Worry: Not on file    Inability: Not on file  . Transportation needs:    Medical: Not on file    Non-medical: Not on file  Tobacco Use  . Smoking status: Never Smoker  . Smokeless tobacco: Never Used  Substance and Sexual Activity  . Alcohol use: No  . Drug use: No  . Sexual activity: Not Currently  Lifestyle  . Physical activity:    Days per week: Not on file    Minutes per session: Not on file  . Stress: Not on file  Relationships  . Social connections:    Talks on phone: Not on file    Gets together: Not on file    Attends religious service: Not on file    Active member of club or organization: Not on file    Attends meetings of clubs or organizations: Not on file    Relationship status: Not on file  . Intimate partner violence:    Fear of current or ex partner: Not on file    Emotionally abused: Not on file    Physically abused: Not on file    Forced sexual activity: Not on file  Other Topics  Concern  . Not on file  Social History Narrative  . Not on file    Outpatient Medications Prior to Visit  Medication Sig Dispense Refill  . valsartan-hydrochlorothiazide (DIOVAN-HCT) 320-12.5 MG tablet Take 1 tablet by mouth every morning. 30 tablet 11  . amLODipine (NORVASC) 5 MG tablet Take 1 tablet (5 mg total) by mouth daily. (Patient not taking: Reported on 05/24/2018) 90 tablet 3  . naproxen (NAPROSYN) 500 MG tablet Take 1 tablet (500 mg total) by mouth 2 (two) times daily with a meal. 30 tablet 0   No facility-administered medications prior to visit.     No Known Allergies  ROS Review of Systems  Constitutional: Negative.   HENT: Positive for dental problem and hearing loss.   Eyes: Negative.   Respiratory: Negative.   Cardiovascular: Negative.   Gastrointestinal: Negative.   Endocrine: Negative.   Genitourinary: Negative.   Musculoskeletal: Positive for arthralgias.  Skin: Negative.   Allergic/Immunologic: Negative.   Neurological: Negative.   Hematological: Negative.   Psychiatric/Behavioral: Negative.       Objective:  Physical Exam  Constitutional: He is oriented to person, place, and time. He appears well-developed.  HENT:  Head: Normocephalic.  Left Ear: Decreased hearing is noted.  Eyes: Pupils are equal, round, and reactive to light. EOM are normal.  Neck: Normal range of motion.  Cardiovascular: Normal rate and regular rhythm.  Pulmonary/Chest: Effort normal and breath sounds normal.  Abdominal: Soft. Bowel sounds are normal. He exhibits distension.  Musculoskeletal: Normal range of motion.  Neurological: He is alert and oriented to person, place, and time.  Skin: Skin is warm and dry.  Psychiatric: He has a normal mood and affect.    BP (!) 147/103 (BP Location: Right Arm, Patient Position: Sitting, Cuff Size: Normal)   Pulse 84   Temp 97.6 F (36.4 C) (Oral)   Ht 5\' 10"  (1.778 m)   Wt 202 lb (91.6 kg)   SpO2 96%   BMI 28.98 kg/m  Wt  Readings from Last 3 Encounters:  05/24/18 202 lb (91.6 kg)  02/03/18 194 lb (88 kg)  12/28/17 200 lb (90.7 kg)     Health Maintenance Due  Topic Date Due  . COLONOSCOPY  03/05/1999  . PNA vac Low Risk Adult (1 of 2 - PCV13) 03/04/2014    There are no preventive care reminders to display for this patient.  Lab Results  Component Value Date   TSH 0.976 02/03/2018   Lab Results  Component Value Date   WBC 5.0 10/30/2014   HGB 12.8 (L) 10/30/2014   HCT 38.9 (L) 10/30/2014   MCV 87.6 10/30/2014   PLT 190 10/30/2014   Lab Results  Component Value Date   NA 146 (H) 12/28/2017   K 4.2 12/28/2017   CO2 27 12/28/2017   GLUCOSE 103 (H) 12/28/2017   BUN 17 12/28/2017   CREATININE 1.02 12/28/2017   BILITOT 0.6 10/30/2014   ALKPHOS 45 10/30/2014   AST 26 10/30/2014   ALT 22 10/30/2014   PROT 7.1 10/30/2014   ALBUMIN 4.0 10/30/2014   CALCIUM 9.3 12/28/2017   ANIONGAP 9 12/28/2017   Lab Results  Component Value Date   CHOL 134 02/03/2018   Lab Results  Component Value Date   HDL 36 (L) 02/03/2018   Lab Results  Component Value Date   LDLCALC 81 02/03/2018   Lab Results  Component Value Date   TRIG 86 02/03/2018   Lab Results  Component Value Date   CHOLHDL 3.7 02/03/2018   No results found for: HGBA1C    Assessment & Plan:  Bryce Kane was seen today for follow-up.  Diagnoses and all orders for this visit:  Hypertension, unspecified type -     amLODipine (NORVASC) 5 MG tablet; Take 1 tablet (5 mg total) by mouth daily. -     valsartan-hydrochlorothiazide (DIOVAN-HCT) 320-12.5 MG tablet; Take 1 tablet by mouth every morning.  Bilateral hearing loss, unspecified hearing loss type wear a hearing aid   Dietary counseling discussed what food he needed to reduce eating esp beans/winners he loves, try to eliminate can foods eat prk in moderation and decrease salt/sodium in take    Problem List Items Addressed This Visit    None    Visit Diagnoses     Hypertension, unspecified type    -  Primary   Relevant Medications   amLODipine (NORVASC) 5 MG tablet   valsartan-hydrochlorothiazide (DIOVAN-HCT) 320-12.5 MG tablet   Bilateral hearing loss, unspecified hearing loss type       Dietary counseling  Meds ordered this encounter  Medications  . amLODipine (NORVASC) 5 MG tablet    Sig: Take 1 tablet (5 mg total) by mouth daily.    Dispense:  90 tablet    Refill:  3  . valsartan-hydrochlorothiazide (DIOVAN-HCT) 320-12.5 MG tablet    Sig: Take 1 tablet by mouth every morning.    Dispense:  30 tablet    Refill:  11    Follow-up: Return in about 2 weeks (around 06/07/2018) for nurse visit Bp ck.    Grayce Sessions, NP

## 2018-05-24 NOTE — Patient Instructions (Signed)
DASH Eating Plan  DASH stands for "Dietary Approaches to Stop Hypertension." The DASH eating plan is a healthy eating plan that has been shown to reduce high blood pressure (hypertension). It may also reduce your risk for type 2 diabetes, heart disease, and stroke. The DASH eating plan may also help with weight loss.  What are tips for following this plan?    General guidelines   Avoid eating more than 2,300 mg (milligrams) of salt (sodium) a day. If you have hypertension, you may need to reduce your sodium intake to 1,500 mg a day.   Limit alcohol intake to no more than 1 drink a day for nonpregnant women and 2 drinks a day for men. One drink equals 12 oz of beer, 5 oz of wine, or 1 oz of hard liquor.   Work with your health care provider to maintain a healthy body weight or to lose weight. Ask what an ideal weight is for you.   Get at least 30 minutes of exercise that causes your heart to beat faster (aerobic exercise) most days of the week. Activities may include walking, swimming, or biking.   Work with your health care provider or diet and nutrition specialist (dietitian) to adjust your eating plan to your individual calorie needs.  Reading food labels     Check food labels for the amount of sodium per serving. Choose foods with less than 5 percent of the Daily Value of sodium. Generally, foods with less than 300 mg of sodium per serving fit into this eating plan.   To find whole grains, look for the word "whole" as the first word in the ingredient list.  Shopping   Buy products labeled as "low-sodium" or "no salt added."   Buy fresh foods. Avoid canned foods and premade or frozen meals.  Cooking   Avoid adding salt when cooking. Use salt-free seasonings or herbs instead of table salt or sea salt. Check with your health care provider or pharmacist before using salt substitutes.   Do not fry foods. Cook foods using healthy methods such as baking, boiling, grilling, and broiling instead.   Cook with  heart-healthy oils, such as olive, canola, soybean, or sunflower oil.  Meal planning   Eat a balanced diet that includes:  ? 5 or more servings of fruits and vegetables each day. At each meal, try to fill half of your plate with fruits and vegetables.  ? Up to 6-8 servings of whole grains each day.  ? Less than 6 oz of lean meat, poultry, or fish each day. A 3-oz serving of meat is about the same size as a deck of cards. One egg equals 1 oz.  ? 2 servings of low-fat dairy each day.  ? A serving of nuts, seeds, or beans 5 times each week.  ? Heart-healthy fats. Healthy fats called Omega-3 fatty acids are found in foods such as flaxseeds and coldwater fish, like sardines, salmon, and mackerel.   Limit how much you eat of the following:  ? Canned or prepackaged foods.  ? Food that is high in trans fat, such as fried foods.  ? Food that is high in saturated fat, such as fatty meat.  ? Sweets, desserts, sugary drinks, and other foods with added sugar.  ? Full-fat dairy products.   Do not salt foods before eating.   Try to eat at least 2 vegetarian meals each week.   Eat more home-cooked food and less restaurant, buffet, and fast food.     When eating at a restaurant, ask that your food be prepared with less salt or no salt, if possible.  What foods are recommended?  The items listed may not be a complete list. Talk with your dietitian about what dietary choices are best for you.  Grains  Whole-grain or whole-wheat bread. Whole-grain or whole-wheat pasta. Brown rice. Oatmeal. Quinoa. Bulgur. Whole-grain and low-sodium cereals. Pita bread. Low-fat, low-sodium crackers. Whole-wheat flour tortillas.  Vegetables  Fresh or frozen vegetables (raw, steamed, roasted, or grilled). Low-sodium or reduced-sodium tomato and vegetable juice. Low-sodium or reduced-sodium tomato sauce and tomato paste. Low-sodium or reduced-sodium canned vegetables.  Fruits  All fresh, dried, or frozen fruit. Canned fruit in natural juice (without  added sugar).  Meat and other protein foods  Skinless chicken or turkey. Ground chicken or turkey. Pork with fat trimmed off. Fish and seafood. Egg whites. Dried beans, peas, or lentils. Unsalted nuts, nut butters, and seeds. Unsalted canned beans. Lean cuts of beef with fat trimmed off. Low-sodium, lean deli meat.  Dairy  Low-fat (1%) or fat-free (skim) milk. Fat-free, low-fat, or reduced-fat cheeses. Nonfat, low-sodium ricotta or cottage cheese. Low-fat or nonfat yogurt. Low-fat, low-sodium cheese.  Fats and oils  Soft margarine without trans fats. Vegetable oil. Low-fat, reduced-fat, or light mayonnaise and salad dressings (reduced-sodium). Canola, safflower, olive, soybean, and sunflower oils. Avocado.  Seasoning and other foods  Herbs. Spices. Seasoning mixes without salt. Unsalted popcorn and pretzels. Fat-free sweets.  What foods are not recommended?  The items listed may not be a complete list. Talk with your dietitian about what dietary choices are best for you.  Grains  Baked goods made with fat, such as croissants, muffins, or some breads. Dry pasta or rice meal packs.  Vegetables  Creamed or fried vegetables. Vegetables in a cheese sauce. Regular canned vegetables (not low-sodium or reduced-sodium). Regular canned tomato sauce and paste (not low-sodium or reduced-sodium). Regular tomato and vegetable juice (not low-sodium or reduced-sodium). Pickles. Olives.  Fruits  Canned fruit in a light or heavy syrup. Fried fruit. Fruit in cream or butter sauce.  Meat and other protein foods  Fatty cuts of meat. Ribs. Fried meat. Bacon. Sausage. Bologna and other processed lunch meats. Salami. Fatback. Hotdogs. Bratwurst. Salted nuts and seeds. Canned beans with added salt. Canned or smoked fish. Whole eggs or egg yolks. Chicken or turkey with skin.  Dairy  Whole or 2% milk, cream, and half-and-half. Whole or full-fat cream cheese. Whole-fat or sweetened yogurt. Full-fat cheese. Nondairy creamers. Whipped toppings.  Processed cheese and cheese spreads.  Fats and oils  Butter. Stick margarine. Lard. Shortening. Ghee. Bacon fat. Tropical oils, such as coconut, palm kernel, or palm oil.  Seasoning and other foods  Salted popcorn and pretzels. Onion salt, garlic salt, seasoned salt, table salt, and sea salt. Worcestershire sauce. Tartar sauce. Barbecue sauce. Teriyaki sauce. Soy sauce, including reduced-sodium. Steak sauce. Canned and packaged gravies. Fish sauce. Oyster sauce. Cocktail sauce. Horseradish that you find on the shelf. Ketchup. Mustard. Meat flavorings and tenderizers. Bouillon cubes. Hot sauce and Tabasco sauce. Premade or packaged marinades. Premade or packaged taco seasonings. Relishes. Regular salad dressings.  Where to find more information:   National Heart, Lung, and Blood Institute: www.nhlbi.nih.gov   American Heart Association: www.heart.org  Summary   The DASH eating plan is a healthy eating plan that has been shown to reduce high blood pressure (hypertension). It may also reduce your risk for type 2 diabetes, heart disease, and stroke.   With the   DASH eating plan, you should limit salt (sodium) intake to 2,300 mg a day. If you have hypertension, you may need to reduce your sodium intake to 1,500 mg a day.   When on the DASH eating plan, aim to eat more fresh fruits and vegetables, whole grains, lean proteins, low-fat dairy, and heart-healthy fats.   Work with your health care provider or diet and nutrition specialist (dietitian) to adjust your eating plan to your individual calorie needs.  This information is not intended to replace advice given to you by your health care provider. Make sure you discuss any questions you have with your health care provider.  Document Released: 03/05/2011 Document Revised: 03/09/2016 Document Reviewed: 03/09/2016  Elsevier Interactive Patient Education  2019 [employer] Inc[employer]

## 2018-06-07 ENCOUNTER — Ambulatory Visit (INDEPENDENT_AMBULATORY_CARE_PROVIDER_SITE_OTHER): Payer: Medicare Other

## 2018-06-07 ENCOUNTER — Encounter (INDEPENDENT_AMBULATORY_CARE_PROVIDER_SITE_OTHER): Payer: Self-pay

## 2018-06-07 VITALS — BP 126/82 | HR 93 | Temp 97.9°F | Ht 70.0 in

## 2018-06-07 DIAGNOSIS — Z0130 Encounter for examination of blood pressure without abnormal findings: Secondary | ICD-10-CM

## 2018-08-20 ENCOUNTER — Ambulatory Visit (HOSPITAL_COMMUNITY)
Admission: EM | Admit: 2018-08-20 | Discharge: 2018-08-20 | Disposition: A | Discharge status: Home / Self Care | Payer: Medicare Other | Attending: Physician Assistant | Admitting: Physician Assistant

## 2018-08-20 ENCOUNTER — Encounter (HOSPITAL_COMMUNITY): Payer: Self-pay | Admitting: Emergency Medicine

## 2018-08-20 DIAGNOSIS — T161XXA Foreign body in right ear, initial encounter: Secondary | ICD-10-CM

## 2018-08-20 NOTE — ED Triage Notes (Signed)
Pt c/o R ear pain for the last few days.

## 2018-08-20 NOTE — Discharge Instructions (Signed)
Return if any problems.

## 2018-08-20 NOTE — ED Provider Notes (Signed)
[facility]    CSN: [id number] Arrival date & time: 08/20/18  1037     History   Chief Complaint Chief Complaint  Patient presents with  . Otalgia    HPI Bryce Kane is a 68 y.o. male.   Pt reports he has the ear piece from his hearing aid stuck in his ear   The history is provided by the patient. No language interpreter was used.  Otalgia  Location:  Right Behind ear:  No abnormality Quality:  Aching Severity:  Moderate Onset quality:  Gradual Timing:  Constant Chronicity:  New Relieved by:  Nothing Ineffective treatments:  None tried   Past Medical History:  Diagnosis Date  . Hypertension     There are no active problems to display for this patient.   Past Surgical History:  Procedure Laterality Date  . HERNIA REPAIR         Home Medications    Prior to Admission medications   Medication Sig Start Date End Date Taking? Authorizing Provider  amLODipine (NORVASC) 5 MG tablet Take 1 tablet (5 mg total) by mouth daily. 05/24/18   Grayce Sessions, NP  valsartan-hydrochlorothiazide (DIOVAN-HCT) 320-12.5 MG tablet Take 1 tablet by mouth every morning. 05/24/18   Grayce Sessions, NP    Family History No family history on file.  Social History Social History   Tobacco Use  . Smoking status: Never Smoker  . Smokeless tobacco: Never Used  Substance Use Topics  . Alcohol use: No  . Drug use: No     Allergies   Patient has no known allergies.   Review of Systems Review of Systems  HENT: Positive for ear pain.   All other systems reviewed and are negative.    Physical Exam Triage Vital Signs ED Triage Vitals [08/20/18 1054]  Enc Vitals Group     BP 123/86     Pulse Rate 100     Resp 18     Temp 98.6 F (37 C)     Temp src      SpO2 98 %     Weight      Height      Head Circumference      Peak Flow      Pain Score      Pain Loc      Pain Edu?      Excl. in GC?    No data found.  Updated Vital Signs BP  123/86   Pulse 100   Temp 98.6 F (37 C)   Resp 18   SpO2 98%   Visual Acuity Right Eye Distance:   Left Eye Distance:   Bilateral Distance:    Right Eye Near:   Left Eye Near:    Bilateral Near:     Physical Exam Vitals signs reviewed.  HENT:     Ears:     Comments: plastic ear piece right canal Neck:     Musculoskeletal: Normal range of motion.  Cardiovascular:     Rate and Rhythm: Normal rate.  Pulmonary:     Effort: Pulmonary effort is normal.  Musculoskeletal: Normal range of motion.  Skin:    General: Skin is warm.  Neurological:     General: No focal deficit present.     Mental Status: He is alert.  Psychiatric:        Mood and Affect: Mood normal.      UC Treatments / Results  Labs (all labs ordered are listed, but  only abnormal results are displayed) Labs Reviewed - No data to display  EKG None  Radiology No results found.  Procedures Foreign Body Removal Date/Time: 08/20/2018 11:38 AM Performed by: Elson AreasSofia, Candler Ginsberg K, PA-C Authorized by: Elson AreasSofia, Rickiya Picariello K, PA-C   Consent:    Consent obtained:  Verbal   Consent given by:  Patient   Alternatives discussed:  Delayed treatment Pre-procedure details:    Imaging:  None Anesthesia (see MAR for exact dosages):    Anesthesia method:  None Procedure type:    Procedure complexity:  Simple Procedure details:    Removal mechanism:  Forceps Comments:     One plastic ear piece removed   (including critical care time)  Medications Ordered in UC Medications - No data to display  Initial Impression / Assessment and Plan / UC Course  I have reviewed the triage vital signs and the nursing notes.  Pertinent labs & imaging results that were available during my care of the patient were reviewed by me and considered in my medical decision making (see chart for details).      Final Clinical Impressions(s) / UC Diagnoses   Final diagnoses:  Foreign body of right ear, initial encounter     Discharge  Instructions     Return if any problems.   ED Prescriptions    None     Controlled Substance Prescriptions [place] Controlled Substance Registry consulted? Not Applicable  An After Visit Summary was printed and given to the patient.    Elson AreasSofia, Zalan Shidler K, [place]PA-C 08/20/18 1141

## 2019-03-29 ENCOUNTER — Ambulatory Visit (HOSPITAL_COMMUNITY)
Admission: EM | Admit: 2019-03-29 | Discharge: 2019-03-29 | Disposition: A | Discharge status: Home / Self Care | Payer: Medicare Other | Attending: Family Medicine | Admitting: Family Medicine

## 2019-03-29 ENCOUNTER — Encounter (HOSPITAL_COMMUNITY): Payer: Self-pay

## 2019-03-29 ENCOUNTER — Other Ambulatory Visit: Payer: Self-pay

## 2019-03-29 ENCOUNTER — Ambulatory Visit (INDEPENDENT_AMBULATORY_CARE_PROVIDER_SITE_OTHER): Payer: Medicare Other

## 2019-03-29 DIAGNOSIS — R1084 Generalized abdominal pain: Secondary | ICD-10-CM

## 2019-03-29 DIAGNOSIS — R109 Unspecified abdominal pain: Secondary | ICD-10-CM | POA: Diagnosis not present

## 2019-03-29 DIAGNOSIS — N200 Calculus of kidney: Secondary | ICD-10-CM

## 2019-03-29 LAB — POCT URINALYSIS DIP (DEVICE)
Bilirubin Urine: NEGATIVE
Glucose, UA: NEGATIVE mg/dL
Hgb urine dipstick: NEGATIVE
Ketones, ur: NEGATIVE mg/dL
Leukocytes,Ua: NEGATIVE
Nitrite: NEGATIVE
Protein, ur: 30 mg/dL — AB
Specific Gravity, Urine: >=1.03 (ref 1.005–1.030)
Urobilinogen, UA: 1 mg/dL (ref 0.0–1.0)
pH: 5.5 (ref 5.0–8.0)

## 2019-03-29 MED ORDER — TAMSULOSIN HCL 0.4 MG PO CAPS: 0.4000 mg | ORAL_CAPSULE | Freq: Every day | ORAL | 0 refills | Status: AC

## 2019-03-29 MED ORDER — OMEPRAZOLE 20 MG PO CPDR: 20.0000 mg | DELAYED_RELEASE_CAPSULE | Freq: Every day | ORAL | 0 refills | Status: DC

## 2019-03-29 NOTE — ED Notes (Signed)
2 RNs attempted to draw blood without success

## 2019-03-29 NOTE — ED Triage Notes (Signed)
Pt presents withsharp lower abdominal pain on both side.  Pt also complains of recurrent numbness in both feet.

## 2019-03-29 NOTE — Discharge Instructions (Signed)
Xray showed possible stone in right kidney- begin flomax daily  Begin omeprazole daily for next 2 weeks to help with any reflux/gastritis contributing to symptoms Please drink plenty of water We were unable to obtain blood work today Follow up with primary care for further evaluation of abdominal pain if persisting Follow up in the emergency room if pain worsening

## 2019-03-30 NOTE — ED Provider Notes (Signed)
[facility]    CSN: [id number] Arrival date & time: 03/29/19  0809      History   Chief Complaint Chief Complaint  Patient presents with  . Abdominal Pain  . Numbness in Both Feet    HPI Bryce Kane is a 68 y.o. male history of hypertension presenting today for evaluation of abdominal pain.  Patient states that he has had abdominal pain for the past 2 to 3 weeks.  At times he will get a sharp sensation throughout his abdomen.  He denies any specific area where pain is worse.  Denies associated nausea or vomiting.  Had one episode of looser stools a few days ago, but no persistent bowel changes.  Last bowel movement was last night and was normal consistency.  He denies significant straining with bowels.  Typically goes every day.  Has been eating and drinking without worsening pain.  He denies any testicle pain or testicle swelling.  Denies any changes in urination of dysuria, hematuria, incomplete voiding.  Does occasionally have some left-sided back pain.  He also endorses some numbness in bilateral feet.  He denies chest pain or shortness of breath.  Denies any fevers.  Denies prior surgeries to abdomen, chart review he has had a history of hernia repair.  HPI  Past Medical History:  Diagnosis Date  . Hypertension     There are no problems to display for this patient.   Past Surgical History:  Procedure Laterality Date  . HERNIA REPAIR         Home Medications    Prior to Admission medications   Medication Sig Start Date End Date Taking? Authorizing Provider  amLODipine (NORVASC) 5 MG tablet Take 1 tablet (5 mg total) by mouth daily. 05/24/18   Grayce Sessions, NP  omeprazole (PRILOSEC) 20 MG capsule Take 1 capsule (20 mg total) by mouth daily. 03/29/19   ,  C, PA-C  tamsulosin (FLOMAX) 0.4 MG CAPS capsule Take 1 capsule (0.4 mg total) by mouth daily for 15 days. 03/29/19 04/13/19  ,  C, PA-C  valsartan-hydrochlorothiazide  (DIOVAN-HCT) 320-12.5 MG tablet Take 1 tablet by mouth every morning. 05/24/18   Grayce Sessions, NP    Family History Family History  Family history unknown: Yes    Social History Social History   Tobacco Use  . Smoking status: Never Smoker  . Smokeless tobacco: Never Used  Substance Use Topics  . Alcohol use: No  . Drug use: No     Allergies   Patient has no known allergies.   Review of Systems Review of Systems  Constitutional: Negative for activity change, appetite change, chills, fatigue and fever.  HENT: Negative for congestion, ear pain, rhinorrhea and sore throat.   Eyes: Negative for discharge and redness.  Respiratory: Negative for cough, chest tightness and shortness of breath.   Cardiovascular: Negative for chest pain and leg swelling.  Gastrointestinal: Positive for abdominal pain. Negative for diarrhea, nausea and vomiting.  Genitourinary: Negative for decreased urine volume, difficulty urinating, dysuria, hematuria and testicular pain.  Musculoskeletal: Positive for back pain. Negative for myalgias.  Skin: Negative for rash.  Neurological: Positive for numbness. Negative for dizziness, light-headedness and headaches.     Physical Exam Triage Vital Signs ED Triage Vitals  Enc Vitals Group     BP 03/29/19 0829 134/68     Pulse Rate 03/29/19 0829 93     Resp 03/29/19 0829 16     Temp 03/29/19 0829 98.4 F (36.9  C)     Temp Source 03/29/19 0829 Oral     SpO2 03/29/19 0829 100 %     Weight --      Height --      Head Circumference --      Peak Flow --      Pain Score 03/29/19 0828 6     Pain Loc --      Pain Edu? --      Excl. in GC? --    No data found.  Updated Vital Signs BP 134/68 (BP Location: Left Arm)   Pulse 93   Temp 98.4 F (36.9 C) (Oral)   Resp 16   SpO2 100%   Visual Acuity Right Eye Distance:   Left Eye Distance:   Bilateral Distance:    Right Eye Near:   Left Eye Near:    Bilateral Near:     Physical Exam  Vitals and nursing note reviewed.  Constitutional:      Appearance: He is well-developed.     Comments: Sitting comfortably on exam table, no acute distress  HENT:     Head: Normocephalic and atraumatic.     Ears:     Comments: Bilateral ears without tenderness to palpation of external auricle, tragus and mastoid, EAC's without erythema or swelling, TM's with good bony landmarks and cone of light. Non erythematous.     Mouth/Throat:     Comments: Oral mucosa pink and moist, no tonsillar enlargement or exudate. Posterior pharynx patent and nonerythematous, no uvula deviation or swelling. Normal phonation. Palate elevates symmetrically Eyes:     Conjunctiva/sclera: Conjunctivae normal.  Cardiovascular:     Rate and Rhythm: Normal rate and regular rhythm.     Heart sounds: No murmur.  Pulmonary:     Effort: Pulmonary effort is normal. No respiratory distress.     Breath sounds: Normal breath sounds.     Comments: Breathing comfortably at rest, CTABL, no wheezing, rales or other adventitious sounds auscultated Abdominal:     Palpations: Abdomen is soft.     Tenderness: There is no abdominal tenderness.     Comments: Soft, nondistended, generalized tenderness at various aspects on right and left side of abdomen, no focal tenderness, negative rebound, negative Rovsing, negative McBurney's, negative Murphy's  Musculoskeletal:     Cervical back: Neck supple.     Comments: Bilateral feet without swelling erythema or discoloration, dorsalis pedis 2+ bilaterally, cap refill less than 2 seconds, full active range of motion of ankle, able to wiggle toes  Strength at hips and knees 5/5 and equal bilaterally, patellar reflex 2+ bilaterally  Skin:    General: Skin is warm and dry.  Neurological:     Mental Status: He is alert.      UC Treatments / Results  Labs (all labs ordered are listed, but only abnormal results are displayed) Labs Reviewed  POCT URINALYSIS DIP (DEVICE) - Abnormal;  Notable for the following components:      Result Value   Protein, ur 30 (*)    All other components within normal limits    EKG   Radiology DG Abd 2 Views  Result Date: 03/29/2019 CLINICAL DATA:  Abdominal pain EXAM: ABDOMEN - 2 VIEW COMPARISON:  11/13/2004 FINDINGS: Normal bowel gas pattern. Negative for ileus or bowel obstruction. No free air Possible small left renal calculus.  No acute skeletal abnormality. IMPRESSION: Normal bowel gas pattern Possible small left renal calculus. This is not seen on a prior CT abdomen of 10/30/2014.  Electronically Signed   By: Franchot Gallo M.D.   On: 03/29/2019 09:54    Procedures Procedures (including critical care time)  Medications Ordered in UC Medications - No data to display  Initial Impression / Assessment and Plan / UC Course  I have reviewed the triage vital signs and the nursing notes.  Pertinent labs & imaging results that were available during my care of the patient were reviewed by me and considered in my medical decision making (see chart for details).     UA unremarkable, does appear concentrated, x-ray with possible left renal calculus.  Possible cause of back pain, but not convinced that this is the cause of his abdominal pain, no hematuria and abdominal pain generalized.  We will still proceed and treat for kidney stone with Flomax, will provide omeprazole to treat for any underlying gastritis/GERD contributing to discomfort.  Do not suspect abdominal emergency at this time, negative peritoneal signs.  Unable to obtain CMP, CBC and lipase blood work, will recommend pushing fluids and continued monitoring.  Recommended following up with primary care if symptoms persisting for further evaluation.  Provided contact info.  If pain worsening to follow-up in the emergency room as may need scan or ultrasound.  Discussed strict return precautions. Patient verbalized understanding and is agreeable with plan.  Final Clinical  Impressions(s) / UC Diagnoses   Final diagnoses:  Generalized abdominal pain  Renal calculus, left     Discharge Instructions     Xray showed possible stone in right kidney- begin flomax daily  Begin omeprazole daily for next 2 weeks to help with any reflux/gastritis contributing to symptoms Please drink plenty of water We were unable to obtain blood work today Follow up with primary care for further evaluation of abdominal pain if persisting Follow up in the emergency room if pain worsening   ED Prescriptions    Medication Sig Dispense Auth. Provider   tamsulosin (FLOMAX) 0.4 MG CAPS capsule Take 1 capsule (0.4 mg total) by mouth daily for 15 days. 15 capsule ,  C, PA-C   omeprazole (PRILOSEC) 20 MG capsule Take 1 capsule (20 mg total) by mouth daily. 14 capsule , Agency C, PA-C     PDMP not reviewed this encounter.   Janith Lima, [place] 03/30/19 [phone]

## 2019-04-19 ENCOUNTER — Other Ambulatory Visit: Payer: Self-pay

## 2019-04-19 ENCOUNTER — Ambulatory Visit (INDEPENDENT_AMBULATORY_CARE_PROVIDER_SITE_OTHER): Payer: Medicare Other | Admitting: Primary Care

## 2019-04-19 ENCOUNTER — Encounter (INDEPENDENT_AMBULATORY_CARE_PROVIDER_SITE_OTHER): Payer: Self-pay | Admitting: Primary Care

## 2019-04-19 VITALS — BP 144/83 | HR 105 | Temp 97.2°F | Wt 180.6 lb

## 2019-04-19 DIAGNOSIS — R351 Nocturia: Secondary | ICD-10-CM | POA: Diagnosis not present

## 2019-04-19 DIAGNOSIS — Z13220 Encounter for screening for lipoid disorders: Secondary | ICD-10-CM | POA: Diagnosis not present

## 2019-04-19 DIAGNOSIS — M25572 Pain in left ankle and joints of left foot: Secondary | ICD-10-CM

## 2019-04-19 DIAGNOSIS — H9193 Unspecified hearing loss, bilateral: Secondary | ICD-10-CM | POA: Diagnosis not present

## 2019-04-19 DIAGNOSIS — I10 Essential (primary) hypertension: Secondary | ICD-10-CM

## 2019-04-19 MED ORDER — AMLODIPINE BESYLATE 5 MG PO TABS: 5.0000 mg | ORAL_TABLET | Freq: Every day | ORAL | 3 refills | Status: DC

## 2019-04-19 MED ORDER — VALSARTAN-HYDROCHLOROTHIAZIDE 320-12.5 MG PO TABS: 1.0000 | ORAL_TABLET | Freq: Every morning | ORAL | 11 refills | Status: DC

## 2019-04-19 MED ORDER — BLOOD PRESSURE MONITOR KIT: 1.0000 | PACK | Freq: Three times a day (TID) | 0 refills | Status: DC | PRN

## 2019-04-19 NOTE — Progress Notes (Signed)
Pt complains of heel pain in the right heel

## 2019-04-19 NOTE — Patient Instructions (Signed)

## 2019-04-20 LAB — CBC WITH DIFFERENTIAL/PLATELET
Basophils Absolute: 0.1 10*3/uL (ref 0.0–0.2)
Basos: 1 %
EOS (ABSOLUTE): 0.1 10*3/uL (ref 0.0–0.4)
Eos: 1 %
Hematocrit: 40.1 % (ref 37.5–51.0)
Hemoglobin: 13.5 g/dL (ref 13.0–17.7)
Immature Grans (Abs): 0 10*3/uL (ref 0.0–0.1)
Immature Granulocytes: 0 %
Lymphocytes Absolute: 1.5 10*3/uL (ref 0.7–3.1)
Lymphs: 30 %
MCH: 29.2 pg (ref 26.6–33.0)
MCHC: 33.7 g/dL (ref 31.5–35.7)
MCV: 87 fL (ref 79–97)
Monocytes Absolute: 0.4 10*3/uL (ref 0.1–0.9)
Monocytes: 8 %
Neutrophils Absolute: 3.1 10*3/uL (ref 1.4–7.0)
Neutrophils: 60 %
Platelets: 258 10*3/uL (ref 150–450)
RBC: 4.62 x10E6/uL (ref 4.14–5.80)
RDW: 13.5 % (ref 11.6–15.4)
WBC: 5.1 10*3/uL (ref 3.4–10.8)

## 2019-04-20 LAB — LIPID PANEL
Chol/HDL Ratio: 3.5 ratio (ref 0.0–5.0)
Cholesterol, Total: 139 mg/dL (ref 100–199)
HDL: 40 mg/dL (ref >39)
LDL Chol Calc (NIH): 78 mg/dL (ref 0–99)
Triglycerides: 114 mg/dL (ref 0–149)
VLDL Cholesterol Cal: 21 mg/dL (ref 5–40)

## 2019-04-20 LAB — COMPREHENSIVE METABOLIC PANEL
ALT: 17 IU/L (ref 0–44)
AST: 18 IU/L (ref 0–40)
Albumin/Globulin Ratio: 1.5 (ref 1.2–2.2)
Albumin: 4.4 g/dL (ref 3.8–4.8)
Alkaline Phosphatase: 68 IU/L (ref 39–117)
BUN/Creatinine Ratio: 12 (ref 10–24)
BUN: 14 mg/dL (ref 8–27)
Bilirubin Total: 0.7 mg/dL (ref 0.0–1.2)
CO2: 24 mmol/L (ref 20–29)
Calcium: 9.4 mg/dL (ref 8.6–10.2)
Chloride: 105 mmol/L (ref 96–106)
Creatinine, Ser: 1.2 mg/dL (ref 0.76–1.27)
GFR calc Af Amer: 70 mL/min/{1.73_m2} (ref >59)
GFR calc non Af Amer: 61 mL/min/{1.73_m2} (ref >59)
Globulin, Total: 2.9 g/dL (ref 1.5–4.5)
Glucose: 105 mg/dL — ABNORMAL HIGH (ref 65–99)
Potassium: 4.5 mmol/L (ref 3.5–5.2)
Sodium: 144 mmol/L (ref 134–144)
Total Protein: 7.3 g/dL (ref 6.0–8.5)

## 2019-04-20 LAB — PSA: Prostate Specific Ag, Serum: 2.8 ng/mL (ref 0.0–4.0)

## 2019-04-21 ENCOUNTER — Telehealth (INDEPENDENT_AMBULATORY_CARE_PROVIDER_SITE_OTHER): Payer: Self-pay

## 2019-04-21 NOTE — Telephone Encounter (Signed)
Patient was not available. Results provided to his daughter faith per DPR. She is aware of all results per PCP michelle edwards. She will inform the patient. Maryjean Morn, CMA

## 2019-04-21 NOTE — Telephone Encounter (Signed)
-----   Message from Grayce Sessions, NP sent at 04/20/2019  8:48 AM EST ----- I have reviewed all labs and they are normal/unremarkable.

## 2019-04-25 NOTE — Progress Notes (Signed)
Acute Office Visit  Subjective:    Patient ID: Bryce Kane, male    DOB: 1950/07/27, 69 y.o.   MRN: [id number]  Chief Complaint  Patient presents with  . Hypertension    HPI Patient is in today for acute visit complaints of right heal pain. He denies any trauma or falls. Also in for the management of hypertension . Bp is elevated at this visit -Denies shortness of breath, headaches, chest pain or lower extremity edema  Past Medical History:  Diagnosis Date  . Hypertension     Past Surgical History:  Procedure Laterality Date  . HERNIA REPAIR      Family History  Family history unknown: Yes    Social History   Socioeconomic History  . Marital status: Married    Spouse name: Not on file  . Number of children: Not on file  . Years of education: Not on file  . Highest education level: Not on file  Occupational History  . Not on file  Tobacco Use  . Smoking status: Never Smoker  . Smokeless tobacco: Never Used  Substance and Sexual Activity  . Alcohol use: No  . Drug use: No  . Sexual activity: Not Currently  Other Topics Concern  . Not on file  Social History Narrative  . Not on file   Social Determinants of Health   Financial Resource Strain:   . Difficulty of Paying Living Expenses: Not on file  Food Insecurity:   . Worried About Charity fundraiser in the Last Year: Not on file  . Ran Out of Food in the Last Year: Not on file  Transportation Needs:   . Lack of Transportation (Medical): Not on file  . Lack of Transportation (Non-Medical): Not on file  Physical Activity:   . Days of Exercise per Week: Not on file  . Minutes of Exercise per Session: Not on file  Stress:   . Feeling of Stress : Not on file  Social Connections:   . Frequency of Communication with Friends and Family: Not on file  . Frequency of Social Gatherings with Friends and Family: Not on file  . Attends Religious Services: Not on file  . Active Member of Clubs or Organizations:  Not on file  . Attends Archivist Meetings: Not on file  . Marital Status: Not on file  Intimate Partner Violence:   . Fear of Current or Ex-Partner: Not on file  . Emotionally Abused: Not on file  . Physically Abused: Not on file  . Sexually Abused: Not on file    Outpatient Medications Prior to Visit  Medication Sig Dispense Refill  . amLODipine (NORVASC) 5 MG tablet Take 1 tablet (5 mg total) by mouth daily. 90 tablet 3  . valsartan-hydrochlorothiazide (DIOVAN-HCT) 320-12.5 MG tablet Take 1 tablet by mouth every morning. 30 tablet 11  . omeprazole (PRILOSEC) 20 MG capsule Take 1 capsule (20 mg total) by mouth daily. (Patient not taking: Reported on 04/19/2019) 14 capsule 0   No facility-administered medications prior to visit.    No Known Allergies  Review of Systems  Genitourinary:       Nocturia   Musculoskeletal:       Right heal pain   All other systems reviewed and are negative.      Objective:    Physical Exam Vitals reviewed.  Constitutional:      Appearance: Normal appearance.  HENT:     Nose: Nose normal.  Cardiovascular:  Rate and Rhythm: Normal rate and regular rhythm.  Pulmonary:     Effort: Pulmonary effort is normal.     Breath sounds: Normal breath sounds.  Abdominal:     General: Bowel sounds are normal.     Palpations: Abdomen is soft.  Musculoskeletal:     Cervical back: Normal range of motion.     Comments: Unstable gait due to right heel pain - thickening of the skin on his heel  Skin:    General: Skin is warm and dry.  Neurological:     Mental Status: He is alert and oriented to person, place, and time.  Psychiatric:        Mood and Affect: Mood normal.        Behavior: Behavior normal.        Thought Content: Thought content normal.     BP (!) 144/83 (BP Location: Right Arm, Patient Position: Sitting, Cuff Size: Normal)   Pulse (!) 105   Temp (!) 97.2 F (36.2 C) (Temporal)   Wt 180 lb 9.6 oz (81.9 kg)   SpO2 97%    BMI 25.91 kg/m  Wt Readings from Last 3 Encounters:  04/19/19 180 lb 9.6 oz (81.9 kg)  05/24/18 202 lb (91.6 kg)  02/03/18 194 lb (88 kg)    Health Maintenance Due  Topic Date Due  . TETANUS/TDAP  03/04/1968  . COLONOSCOPY  03/05/1999  . PNA vac Low Risk Adult (1 of 2 - PCV13) 03/04/2014    There are no preventive care reminders to display for this patient.   Lab Results  Component Value Date   TSH 0.976 02/03/2018   Lab Results  Component Value Date   WBC 5.1 04/19/2019   HGB 13.5 04/19/2019   HCT 40.1 04/19/2019   MCV 87 04/19/2019   PLT 258 04/19/2019   Lab Results  Component Value Date   NA 144 04/19/2019   K 4.5 04/19/2019   CO2 24 04/19/2019   GLUCOSE 105 (H) 04/19/2019   BUN 14 04/19/2019   CREATININE 1.20 04/19/2019   BILITOT 0.7 04/19/2019   ALKPHOS 68 04/19/2019   AST 18 04/19/2019   ALT 17 04/19/2019   PROT 7.3 04/19/2019   ALBUMIN 4.4 04/19/2019   CALCIUM 9.4 04/19/2019   ANIONGAP 9 12/28/2017   Lab Results  Component Value Date   CHOL 139 04/19/2019   Lab Results  Component Value Date   HDL 40 04/19/2019   Lab Results  Component Value Date   LDLCALC 78 04/19/2019   Lab Results  Component Value Date   TRIG 114 04/19/2019   Lab Results  Component Value Date   CHOLHDL 3.5 04/19/2019   No results found for: HGBA1C     Assessment & Plan:  Glade was seen today for hypertension.  Diagnoses and all orders for this visit:  Nocturia Age 31 increase urination at night - has to wake up 2-3 times per night rule out BPH with PSA screening. -     PSA  Hypertension, unspecified type Discussed medication compliance after he wake up and eat bkf take his  blood pressure medication . Goal </=130/80,recommend  low-sodium, DASH diet, , 150 minutes of moderate intensity exercise per week. -     valsartan-hydrochlorothiazide (DIOVAN-HCT) 320-12.5 MG tablet; Take 1 tablet by mouth every morning. -     amLODipine (NORVASC) 5 MG tablet; Take 1  tablet (5 mg total) by mouth daily. -     CBC with Differential -  Comprehensive metabolic panel  Bilateral hearing loss, unspecified hearing loss type Hearing aid in right ear . He is able to engage in appropriate conversation and answers correctly with a little increase volume when talking to him and he looks at your mouth also.   Lipid screening Age variance increase risk for CVD with uncontrolled hypertension. -     Lipid panel  pain right foot Pain primarily in his heel unknown etiology thickening callous appearing skin on heal foot soaps in Epson salt and do not continue to cut/scrape foot. If pain continues will refer to podiatry  Other orders -     Blood Pressure Monitor KIT; 1 kit by Does not apply route 3 (three) times daily as needed.     Meds ordered this encounter  Medications  . valsartan-hydrochlorothiazide (DIOVAN-HCT) 320-12.5 MG tablet    Sig: Take 1 tablet by mouth every morning.    Dispense:  30 tablet    Refill:  11  . amLODipine (NORVASC) 5 MG tablet    Sig: Take 1 tablet (5 mg total) by mouth daily.    Dispense:  90 tablet    Refill:  3  . Blood Pressure Monitor KIT    Sig: 1 kit by Does not apply route 3 (three) times daily as needed.    Dispense:  1 kit    Refill:  0     Kerin Perna, NP

## 2019-06-05 ENCOUNTER — Ambulatory Visit (INDEPENDENT_AMBULATORY_CARE_PROVIDER_SITE_OTHER): Payer: Medicare Other | Admitting: Primary Care

## 2019-06-05 ENCOUNTER — Other Ambulatory Visit: Payer: Self-pay

## 2019-06-05 ENCOUNTER — Encounter (INDEPENDENT_AMBULATORY_CARE_PROVIDER_SITE_OTHER): Payer: Self-pay | Admitting: Primary Care

## 2019-06-05 VITALS — BP 127/85 | HR 90 | Temp 97.3°F | Ht 70.0 in | Wt 189.4 lb

## 2019-06-05 DIAGNOSIS — M25572 Pain in left ankle and joints of left foot: Secondary | ICD-10-CM | POA: Diagnosis not present

## 2019-06-05 DIAGNOSIS — I10 Essential (primary) hypertension: Secondary | ICD-10-CM | POA: Diagnosis not present

## 2019-06-05 MED ORDER — GABAPENTIN 100 MG PO CAPS: 100.0000 mg | ORAL_CAPSULE | Freq: Three times a day (TID) | ORAL | 3 refills | Status: DC

## 2019-06-05 MED ORDER — VALSARTAN-HYDROCHLOROTHIAZIDE 320-12.5 MG PO TABS: 1.0000 | ORAL_TABLET | Freq: Every morning | ORAL | 1 refills | Status: DC

## 2019-06-05 MED ORDER — AMLODIPINE BESYLATE 5 MG PO TABS: 5.0000 mg | ORAL_TABLET | Freq: Every day | ORAL | 3 refills | Status: DC

## 2019-06-05 NOTE — Progress Notes (Signed)
Acute Office Visit  Subjective:    Patient ID: Bryce Kane, male    DOB: 07-Jun-1950, 69 y.o.   MRN: [id number]  Chief Complaint  Patient presents with  . Foot Pain    right;heel    HPI Patient is in today for acute visit he has sharp pains that goes through his left heel making it difficulty to walk.Pain is intermediate numbness and tingling.  Past Medical History:  Diagnosis Date  . Hypertension     Past Surgical History:  Procedure Laterality Date  . HERNIA REPAIR      Family History  Family history unknown: Yes    Social History   Socioeconomic History  . Marital status: Married    Spouse name: Not on file  . Number of children: Not on file  . Years of education: Not on file  . Highest education level: Not on file  Occupational History  . Not on file  Tobacco Use  . Smoking status: Never Smoker  . Smokeless tobacco: Never Used  Substance and Sexual Activity  . Alcohol use: No  . Drug use: No  . Sexual activity: Not Currently  Other Topics Concern  . Not on file  Social History Narrative  . Not on file   Social Determinants of Health   Financial Resource Strain:   . Difficulty of Paying Living Expenses: Not on file  Food Insecurity:   . Worried About Charity fundraiser in the Last Year: Not on file  . Ran Out of Food in the Last Year: Not on file  Transportation Needs:   . Lack of Transportation (Medical): Not on file  . Lack of Transportation (Non-Medical): Not on file  Physical Activity:   . Days of Exercise per Week: Not on file  . Minutes of Exercise per Session: Not on file  Stress:   . Feeling of Stress : Not on file  Social Connections:   . Frequency of Communication with Friends and Family: Not on file  . Frequency of Social Gatherings with Friends and Family: Not on file  . Attends Religious Services: Not on file  . Active Member of Clubs or Organizations: Not on file  . Attends Archivist Meetings: Not on file  .  Marital Status: Not on file  Intimate Partner Violence:   . Fear of Current or Ex-Partner: Not on file  . Emotionally Abused: Not on file  . Physically Abused: Not on file  . Sexually Abused: Not on file    Outpatient Medications Prior to Visit  Medication Sig Dispense Refill  . amLODipine (NORVASC) 5 MG tablet Take 1 tablet (5 mg total) by mouth daily. 90 tablet 3  . Blood Pressure Monitor KIT 1 kit by Does not apply route 3 (three) times daily as needed. 1 kit 0  . valsartan-hydrochlorothiazide (DIOVAN-HCT) 320-12.5 MG tablet Take 1 tablet by mouth every morning. 30 tablet 11  . omeprazole (PRILOSEC) 20 MG capsule Take 1 capsule (20 mg total) by mouth daily. (Patient not taking: Reported on 04/19/2019) 14 capsule 0   No facility-administered medications prior to visit.    No Known Allergies  Review of Systems  Neurological: Positive for numbness.       Tingling heal        Objective:    Physical Exam Constitutional:      Appearance: Normal appearance.  HENT:     Head: Normocephalic.     Ears:     Comments: Right ear hearing  aid    Nose: Nose normal.  Cardiovascular:     Rate and Rhythm: Normal rate and regular rhythm.     Pulses: Normal pulses.     Heart sounds: Normal heart sounds.  Pulmonary:     Effort: Pulmonary effort is normal.     Breath sounds: Normal breath sounds.  Abdominal:     General: Bowel sounds are normal.  Musculoskeletal:        General: Normal range of motion.     Cervical back: Normal range of motion.     Comments: Lordosis   Skin:    General: Skin is warm and dry.  Neurological:     Mental Status: He is alert and oriented to person, place, and time.  Psychiatric:        Mood and Affect: Mood normal.        Behavior: Behavior normal.        Thought Content: Thought content normal.        Judgment: Judgment normal.     BP 127/85 (BP Location: Right Arm, Patient Position: Sitting, Cuff Size: Normal)   Pulse 90   Temp (!) 97.3 F (36.3  C) (Temporal)   Ht _0  (1.778 m)   Wt 189 lb 6.4 oz (85.9 kg)   SpO2 99%   BMI 27.18 kg/m  Wt Readings from Last 3 Encounters:  06/05/19 189 lb 6.4 oz (85.9 kg)  04/19/19 180 lb 9.6 oz (81.9 kg)  05/24/18 202 lb (91.6 kg)    Health Maintenance Due  Topic Date Due  . COLONOSCOPY  03/05/1999    There are no preventive care reminders to display for this patient.   Lab Results  Component Value Date   TSH 0.976 02/03/2018   Lab Results  Component Value Date   WBC 5.1 04/19/2019   HGB 13.5 04/19/2019   HCT 40.1 04/19/2019   MCV 87 04/19/2019   PLT 258 04/19/2019   Lab Results  Component Value Date   NA 144 04/19/2019   K 4.5 04/19/2019   CO2 24 04/19/2019   GLUCOSE 105 (H) 04/19/2019   BUN 14 04/19/2019   CREATININE 1.20 04/19/2019   BILITOT 0.7 04/19/2019   ALKPHOS 68 04/19/2019   AST 18 04/19/2019   ALT 17 04/19/2019   PROT 7.3 04/19/2019   ALBUMIN 4.4 04/19/2019   CALCIUM 9.4 04/19/2019   ANIONGAP 9 12/28/2017   Lab Results  Component Value Date   CHOL 139 04/19/2019   Lab Results  Component Value Date   HDL 40 04/19/2019   Lab Results  Component Value Date   LDLCALC 78 04/19/2019   Lab Results  Component Value Date   TRIG 114 04/19/2019   Lab Results  Component Value Date   CHOLHDL 3.5 04/19/2019   No results found for: HGBA1C     Assessment & Plan:  Bryce Kane was seen today for foot pain.  Diagnoses and all orders for this visit:  Hypertension, unspecified type Blood pressure is well control at goal 127/85 and 69 y/o . Continue medication as prescribe low sodium diet  -     Discontinue: valsartan-hydrochlorothiazide (DIOVAN-HCT) 320-12.5 MG tablet; Take 1 tablet by mouth every morning. -     amLODipine (NORVASC) 5 MG tablet; Take 1 tablet (5 mg total) by mouth daily. -     valsartan-hydrochlorothiazide (DIOVAN-HCT) 320-12.5 MG tablet; Take 1 tablet by mouth every morning.  pain right foot Will prescribe gabapentin for neuropathic  pain exam foot no visual  deformities , open areas or redness  -     gabapentin (NEURONTIN) 100 MG capsule; Take 1 capsule (100 mg total) by mouth 3 (three) times daily.     Meds ordered this encounter  Medications  . valsartan-hydrochlorothiazide (DIOVAN-HCT) 320-12.5 MG tablet    Sig: Take 1 tablet by mouth every morning.    Dispense:  90 tablet    Refill:  1     Kerin Perna, NP

## 2019-06-05 NOTE — Patient Instructions (Signed)
Ankle Pain The ankle joint holds your body weight and allows you to move around. Ankle pain can occur on either side or the back of one ankle or both ankles. Ankle pain may be sharp and burning or dull and aching. There may be tenderness, stiffness, redness, or warmth around the ankle. Many things can cause ankle pain, including an injury to the area and overuse of the ankle. Follow these instructions at home: Activity  Rest your ankle as told by your health care provider. Avoid any activities that cause ankle pain.  Do not use the injured limb to support your body weight until your health care provider says that you can. Use crutches as told by your health care provider.  Do exercises as told by your health care provider.  Ask your health care provider when it is safe to drive if you have a brace on your ankle. If you have a brace:  Wear the brace as told by your health care provider. Remove it only as told by your health care provider.  Loosen the brace if your toes tingle, become numb, or turn cold and blue.  Keep the brace clean.  If the brace is not waterproof: ? Do not let it get wet. ? Cover it with a watertight covering when you take a bath or shower. If you were given an elastic bandage:   Remove it when you take a bath or a shower.  Try not to move your ankle very much, but wiggle your toes from time to time. This helps to prevent swelling.  Adjust the bandage to make it more comfortable if it feels too tight.  Loosen the bandage if you have numbness or tingling in your foot or if your foot turns cold and blue. Managing pain, stiffness, and swelling   If directed, put ice on the painful area. ? If you have a removable brace or elastic bandage, remove it as told by your health care provider. ? Put ice in a plastic bag. ? Place a towel between your skin and the bag. ? Leave the ice on for 20 minutes, 2-3 times a day.  Move your toes often to avoid stiffness and to  lessen swelling.  Raise (elevate) your ankle above the level of your heart while you are sitting or lying down. General instructions  Record information about your pain. Writing down the following may be helpful for you and your health care provider: ? How often you have ankle pain. ? Where the pain is located. ? What the pain feels like.  If treatment involves wearing a prescribed shoe or insole, make sure you wear it correctly and for as long as told by your health care provider.  Take over-the-counter and prescription medicines only as told by your health care provider.  Keep all follow-up visits as told by your health care provider. This is important. Contact a health care provider if:  Your pain gets worse.  Your pain is not relieved with medicines.  You have a fever or chills.  You are having more trouble with walking.  You have new symptoms. Get help right away if:  Your foot, leg, toes, or ankle: ? Tingles or becomes numb. ? Becomes swollen. ? Turns pale or blue. Summary  Ankle pain can occur on either side or the back of one ankle or both ankles.  Ankle pain may be sharp and burning or dull and aching.  Rest your ankle as told by your health care provider.   If told, apply ice to the area.  Take over-the-counter and prescription medicines only as told by your health care provider. This information is not intended to replace advice given to you by your health care provider. Make sure you discuss any questions you have with your health care provider. Document Revised: 07/05/2018 Document Reviewed: 09/22/2017 Elsevier Patient Education  2020 [employer]  

## 2019-07-14 ENCOUNTER — Ambulatory Visit (HOSPITAL_COMMUNITY)
Admission: EM | Admit: 2019-07-14 | Discharge: 2019-07-14 | Disposition: A | Discharge status: Home / Self Care | Payer: Medicare Other | Attending: Internal Medicine | Admitting: Internal Medicine

## 2019-07-14 ENCOUNTER — Other Ambulatory Visit: Payer: Self-pay

## 2019-07-14 ENCOUNTER — Encounter (HOSPITAL_COMMUNITY): Payer: Self-pay

## 2019-07-14 DIAGNOSIS — H1132 Conjunctival hemorrhage, left eye: Secondary | ICD-10-CM | POA: Diagnosis not present

## 2019-07-14 DIAGNOSIS — H1089 Other conjunctivitis: Secondary | ICD-10-CM

## 2019-07-14 MED ORDER — ERYTHROMYCIN 5 MG/GM OP OINT: TOPICAL_OINTMENT | OPHTHALMIC | 0 refills | Status: DC

## 2019-07-14 MED ORDER — OLOPATADINE HCL 0.1 % OP SOLN: 1.0000 [drp] | Freq: Two times a day (BID) | OPHTHALMIC | 12 refills | Status: DC

## 2019-07-14 MED ORDER — LORAZEPAM 2 MG/ML IJ SOLN
INTRAMUSCULAR | Status: AC
  Filled 2019-07-14: qty 1

## 2019-07-14 NOTE — ED Triage Notes (Signed)
Pt states woke up this morning with left eye erythematous and states the inner corner of his eye was hurting, but it's not hurting now. Pt denies vision changes.

## 2019-07-15 NOTE — ED Provider Notes (Signed)
[place]    CSN: [id number] Arrival date & time: 07/14/19  1322      History   Chief Complaint Chief Complaint  Patient presents with  . erythematous eye    HPI Bryce Kane is a 69 y.o. male painful swelling in the left eye of 1 day duration.  Patient denies any trauma to the area.  He denies pain and discomfort with 9.  Patient denies any seasonal allergies.  No double or blurred vision.  No fever or chills.  No trauma to the eye.  Patient has not been rubbing his eye.  He denies any feeling of foreign body in the eye.  Eye discharge is clear Past Medical History:  Diagnosis Date  . Hypertension     There are no problems to display for this patient.   Past Surgical History:  Procedure Laterality Date  . HERNIA REPAIR         Home Medications    Prior to Admission medications   Medication Sig Start Date End Date Taking? Authorizing Provider  amLODipine (NORVASC) 5 MG tablet Take 1 tablet (5 mg total) by mouth daily. 06/05/19   Kerin Perna, NP  Blood Pressure Monitor KIT 1 kit by Does not apply route 3 (three) times daily as needed. 04/19/19   Kerin Perna, NP  erythromycin ophthalmic ointment Place a 1/2 inch ribbon of ointment into the lower eyelid. 07/14/19   Calliope Delangel, Myrene Galas, MD  gabapentin (NEURONTIN) 100 MG capsule Take 1 capsule (100 mg total) by mouth 3 (three) times daily. 06/05/19   Kerin Perna, NP  olopatadine (PATANOL) 0.1 % ophthalmic solution Place 1 drop into the left eye 2 (two) times daily. 07/14/19   Chase Picket, MD  valsartan-hydrochlorothiazide (DIOVAN-HCT) 320-12.5 MG tablet Take 1 tablet by mouth every morning. 06/05/19   Kerin Perna, NP  omeprazole (PRILOSEC) 20 MG capsule Take 1 capsule (20 mg total) by mouth daily. Patient not taking: Reported on 04/19/2019 03/29/19 07/14/19  Janith Lima, PA-C    Family History Family History  Family history unknown: Yes    Social History Social History    Tobacco Use  . Smoking status: Never Smoker  . Smokeless tobacco: Never Used  Substance Use Topics  . Alcohol use: No  . Drug use: No     Allergies   Patient has no known allergies.   Review of Systems Review of Systems  Constitutional: Negative.   Eyes: Positive for discharge, redness and itching. Negative for photophobia, pain and visual disturbance.  Respiratory: Negative.   Cardiovascular: Negative.   Gastrointestinal: Negative.   Genitourinary: Negative.   Musculoskeletal: Negative.   Neurological: Negative.      Physical Exam Triage Vital Signs ED Triage Vitals  Enc Vitals Group     BP 07/14/19 1415 116/78     Pulse Rate 07/14/19 1415 88     Resp 07/14/19 1415 16     Temp 07/14/19 1415 98.5 F (36.9 C)     Temp Source 07/14/19 1415 Oral     SpO2 07/14/19 1415 100 %     Weight 07/14/19 1416 198 lb (89.8 kg)     Height 07/14/19 1416 '5\' 10"'$  (1.778 m)     Head Circumference --      Peak Flow --      Pain Score 07/14/19 1415 0     Pain Loc --      Pain Edu? --      Excl.  in [place]? --    No data found.  Updated Vital Signs BP 116/78   Pulse 88   Temp 98.5 F (36.9 C) (Oral)   Resp 16   Ht '5\' 10"'$  (1.778 m)   Wt 89.8 kg   SpO2 100%   BMI 28.41 kg/m   Visual Acuity Right Eye Distance:   Left Eye Distance:   Bilateral Distance:    Right Eye Near:   Left Eye Near:    Bilateral Near:     Physical Exam Vitals and nursing note reviewed.  Constitutional:      General: He is not in acute distress.    Appearance: He is obese. He is not ill-appearing.  HENT:     Head: Atraumatic.  Eyes:     Pupils: Pupils are equal, round, and reactive to light.     Comments: Right eye erythema.  Extraocular muscle movement intact visual acuity 20/24 in the right eye and 20/13 out to the left.  Fluorescein staining was remarkable for inferior lateral corneal abrasion in the region of 8:00.  Cardiovascular:     Rate and Rhythm: Normal rate and regular rhythm.      Pulses: Normal pulses.     Heart sounds: Murmur present. Friction rub present.  Pulmonary:     Effort: Pulmonary effort is normal.     Breath sounds: Normal breath sounds.  Musculoskeletal:        General: Normal range of motion.  Skin:    Capillary Refill: Capillary refill takes less than 2 seconds.  Neurological:     Mental Status: He is alert.      UC Treatments / Results  Labs (all labs ordered are listed, but only abnormal results are displayed) Labs Reviewed - No data to display  EKG   Radiology No results found.  Procedures Procedures (including critical care time)  Medications Ordered in UC Medications - No data to display  Initial Impression / Assessment and Plan / UC Course  I have reviewed the triage vital signs and the nursing notes.  Pertinent labs & imaging results that were available during my care of the patient were reviewed by me and considered in my medical decision making (see chart for details).     1.  Conjunctivitis with subconjunctival hemorrhage to the right eye. Patanol eyedrops Erythromycin ophthalmic ointment If no improvement patient will need for endoscopy and slit-lamp examination. Return precautions given Final Clinical Impressions(s) / UC Diagnoses   Final diagnoses:  Non-traumatic subconjunctival hemorrhage of left eye  Other conjunctivitis of left eye   Discharge Instructions   None    ED Prescriptions    Medication Sig Dispense Auth. Provider   erythromycin ophthalmic ointment Place a 1/2 inch ribbon of ointment into the lower eyelid. 3.5 g Sacora Hawbaker, Myrene Galas, MD   olopatadine (PATANOL) 0.1 % ophthalmic solution Place 1 drop into the left eye 2 (two) times daily. 5 mL Miri Jose, Myrene Galas, MD     PDMP not reviewed this encounter.   Chase Picket, MD 07/15/19 [phone]

## 2020-05-15 ENCOUNTER — Ambulatory Visit (INDEPENDENT_AMBULATORY_CARE_PROVIDER_SITE_OTHER): Payer: Medicare Other | Admitting: Primary Care

## 2020-05-15 ENCOUNTER — Encounter (INDEPENDENT_AMBULATORY_CARE_PROVIDER_SITE_OTHER): Payer: Self-pay | Admitting: Primary Care

## 2020-05-15 ENCOUNTER — Other Ambulatory Visit: Payer: Self-pay

## 2020-05-15 VITALS — BP 143/77 | HR 101 | Temp 97.3°F | Ht 70.0 in | Wt 187.4 lb

## 2020-05-15 DIAGNOSIS — I10 Essential (primary) hypertension: Secondary | ICD-10-CM | POA: Diagnosis not present

## 2020-05-15 DIAGNOSIS — G4700 Insomnia, unspecified: Secondary | ICD-10-CM | POA: Diagnosis not present

## 2020-05-15 DIAGNOSIS — M2550 Pain in unspecified joint: Secondary | ICD-10-CM

## 2020-05-15 MED ORDER — DICLOFENAC SODIUM 1 % EX GEL: 4.0000 g | Freq: Four times a day (QID) | CUTANEOUS | 1 refills | Status: DC

## 2020-05-15 MED ORDER — MELATONIN 5 MG PO TABS: 5.0000 mg | ORAL_TABLET | Freq: Every evening | ORAL | 0 refills | Status: DC | PRN

## 2020-05-15 MED ORDER — NAPROXEN 500 MG PO TABS: 500.0000 mg | ORAL_TABLET | Freq: Two times a day (BID) | ORAL | 0 refills | Status: DC

## 2020-05-15 MED ORDER — VALSARTAN-HYDROCHLOROTHIAZIDE 320-25 MG PO TABS: 1.0000 | ORAL_TABLET | Freq: Every day | ORAL | 1 refills | Status: DC

## 2020-05-15 NOTE — Progress Notes (Signed)
Bilateral hand pain  Left shoulder pain  Left leg pain  All believed to be caused by arthritis  Insomnia

## 2020-05-15 NOTE — Progress Notes (Signed)
Established Patient Office Visit  Subjective:  Patient ID: Bryce Kane, male    DOB: 1951-03-13  Age: 70 y.o. MRN: [id number]  CC:  Chief Complaint  Patient presents with  . Hand Pain    Bilateral    . Shoulder Pain    Left shoulder   . Insomnia    HPI Bryce Kane is a 70 year old male who presents for several complaints he has been having left shoulder pain, bilateral hand pain making it difficult to use his fingers to pick up objects and difficulty sleeping at night.  Pain 7 out of 10. Does use Naprosyn with some relief. Patient feels he probably has arthritis in his shoulder and hands but the pain is not radiate from shoulder to hands. He also has hypertension and will evaluate at this visit Denies shortness of breath, headaches, chest pain or lower extremity edema.   Past Medical History:  Diagnosis Date  . Hypertension     Past Surgical History:  Procedure Laterality Date  . HERNIA REPAIR      Family History  Family history unknown: Yes    Social History   Socioeconomic History  . Marital status: Married    Spouse name: Not on file  . Number of children: Not on file  . Years of education: Not on file  . Highest education level: Not on file  Occupational History  . Not on file  Tobacco Use  . Smoking status: Never Smoker  . Smokeless tobacco: Never Used  Vaping Use  . Vaping Use: Never used  Substance and Sexual Activity  . Alcohol use: No  . Drug use: No  . Sexual activity: Not Currently  Other Topics Concern  . Not on file  Social History Narrative  . Not on file   Social Determinants of Health   Financial Resource Strain: Not on file  Food Insecurity: Not on file  Transportation Needs: Not on file  Physical Activity: Not on file  Stress: Not on file  Social Connections: Not on file  Intimate Partner Violence: Not on file    Outpatient Medications Prior to Visit  Medication Sig Dispense Refill  . Blood Pressure Monitor KIT 1 kit by  Does not apply route 3 (three) times daily as needed. 1 kit 0  . erythromycin ophthalmic ointment Place a 1/2 inch ribbon of ointment into the lower eyelid. 3.5 g 0  . gabapentin (NEURONTIN) 100 MG capsule Take 1 capsule (100 mg total) by mouth 3 (three) times daily. 90 capsule 3  . olopatadine (PATANOL) 0.1 % ophthalmic solution Place 1 drop into the left eye 2 (two) times daily. 5 mL 12  . amLODipine (NORVASC) 5 MG tablet Take 1 tablet (5 mg total) by mouth daily. 90 tablet 3  . valsartan-hydrochlorothiazide (DIOVAN-HCT) 320-12.5 MG tablet Take 1 tablet by mouth every morning. 90 tablet 1   No facility-administered medications prior to visit.    No Known Allergies  ROS Review of Systems Pertinent positive and negative noted in HPI   Objective:    Physical Exam  BP (!) 143/77 (BP Location: Right Arm, Patient Position: Sitting, Cuff Size: Normal)   Pulse (!) 101   Temp (!) 97.3 F (36.3 C) (Temporal)   Ht $R'5\' 10"'ZP$  (1.778 m)   Wt 187 lb 6.4 oz (85 kg)   SpO2 97%   BMI 26.89 kg/m  Wt Readings from Last 3 Encounters:  05/15/20 187 lb 6.4 oz (85 kg)  07/14/19 198 lb (89.8 kg)  06/05/19 189 lb 6.4 oz (85.9 kg)   General: Vital signs reviewed.  Patient is well-developed and well-nourished, in no acute distress and cooperative with exam.  Head: Normocephalic and atraumatic. Eyes: EOMI, conjunctivae normal, no scleral icterus.  Neck: Supple, trachea midline, normal ROM, no JVD,  or carotid bruit present.  Cardiovascular: RRR, S1 normal, S2 normal, no murmurs, gallops, or rubs. Pulmonary/Chest: Clear to auscultation bilaterally, no wheezes, rales, or rhonchi. Abdominal: Soft, non-tender, non-distended, BS +,  Musculoskeletal: No joint deformities, erythema, or stiffness is present in left arm range of motion decrease  No lower extremity edema bilaterally Neurological: A&O x3, Skin: Warm, dry and intact. No rashes or erythema. Psychiatric: Normal mood and affect. speech and behavior  is normal. Cognition and memory are normal.    Health Maintenance Due  Topic Date Due  . COVID-19 Vaccine (1) Never done  . COLONOSCOPY (Pts 45-65yrs Insurance coverage will need to be confirmed)  Never done    There are no preventive care reminders to display for this patient.  Lab Results  Component Value Date   TSH 0.976 02/03/2018   Lab Results  Component Value Date   WBC 5.1 04/19/2019   HGB 13.5 04/19/2019   HCT 40.1 04/19/2019   MCV 87 04/19/2019   PLT 258 04/19/2019   Lab Results  Component Value Date   NA 144 04/19/2019   K 4.5 04/19/2019   CO2 24 04/19/2019   GLUCOSE 105 (H) 04/19/2019   BUN 14 04/19/2019   CREATININE 1.20 04/19/2019   BILITOT 0.7 04/19/2019   ALKPHOS 68 04/19/2019   AST 18 04/19/2019   ALT 17 04/19/2019   PROT 7.3 04/19/2019   ALBUMIN 4.4 04/19/2019   CALCIUM 9.4 04/19/2019   ANIONGAP 9 12/28/2017   Lab Results  Component Value Date   CHOL 139 04/19/2019   Lab Results  Component Value Date   HDL 40 04/19/2019   Lab Results  Component Value Date   LDLCALC 78 04/19/2019   Lab Results  Component Value Date   TRIG 114 04/19/2019   Lab Results  Component Value Date   CHOLHDL 3.5 04/19/2019   No results found for: HGBA1C    Assessment & Plan:  Bryce Kane was seen today for hand pain, shoulder pain and insomnia.  Diagnoses and all orders for this visit: Bryce Kane was seen today for hand pain, shoulder pain and insomnia.  Diagnoses and all orders for this visit:  Arthralgia, unspecified joint Recently did labs will check rheumatoid arthritic panel with future labs. -     diclofenac Sodium (VOLTAREN) 1 % GEL; Apply 4 g topically 4 (four) times daily. -     naproxen (NAPROSYN) 500 MG tablet; Take 1 tablet (500 mg total) by mouth 2 (two) times daily with a meal.  Hypertension, unspecified type Systolic blood pressure is elevated today this can be contributed to lack of sleep the night before or pain close to goal for his age  41/90 will refill medication and discussed foods high in sodium such as hot dogs, chips, pork, and canned foods -     valsartan-hydrochlorothiazide (DIOVAN HCT) 320-25 MG tablet; Take 1 tablet by mouth daily.  Insomnia, unspecified type Discussed deterrence with difficulty sleeping, trying to decrease factors that can increase sleeping such as caffeine at night, watching TV, things that may can help calming music and or relaxing bath before bedtime. Discussed will try something natural in the event this does not work we can look at other options  I-     melatonin (MELATONIN MAXIMUM STRENGTH) 5 MG TABS; Take 1 tablet (5 mg total) by mouth at bedtime as needed.    Meds ordered this encounter  Medications  . valsartan-hydrochlorothiazide (DIOVAN HCT) 320-25 MG tablet    Sig: Take 1 tablet by mouth daily.    Dispense:  90 tablet    Refill:  1  . diclofenac Sodium (VOLTAREN) 1 % GEL    Sig: Apply 4 g topically 4 (four) times daily.    Dispense:  350 g    Refill:  1  . naproxen (NAPROSYN) 500 MG tablet    Sig: Take 1 tablet (500 mg total) by mouth 2 (two) times daily with a meal.    Dispense:  30 tablet    Refill:  0  . melatonin (MELATONIN MAXIMUM STRENGTH) 5 MG TABS    Sig: Take 1 tablet (5 mg total) by mouth at bedtime as needed.    Dispense:  90 tablet    Refill:  0    Follow-up: Return in about 4 weeks (around 06/12/2020) for BP patient to bring in all medication.    Kerin Perna, NP

## 2020-06-24 ENCOUNTER — Ambulatory Visit (INDEPENDENT_AMBULATORY_CARE_PROVIDER_SITE_OTHER): Payer: Medicare Other | Admitting: Primary Care

## 2020-06-24 ENCOUNTER — Other Ambulatory Visit: Payer: Self-pay

## 2020-06-24 ENCOUNTER — Encounter (INDEPENDENT_AMBULATORY_CARE_PROVIDER_SITE_OTHER): Payer: Self-pay | Admitting: Primary Care

## 2020-06-24 VITALS — BP 129/88 | HR 89 | Temp 97.5°F | Ht 70.0 in | Wt 189.2 lb

## 2020-06-24 DIAGNOSIS — G4700 Insomnia, unspecified: Secondary | ICD-10-CM | POA: Diagnosis not present

## 2020-06-24 DIAGNOSIS — F4321 Adjustment disorder with depressed mood: Secondary | ICD-10-CM

## 2020-06-24 DIAGNOSIS — I10 Essential (primary) hypertension: Secondary | ICD-10-CM | POA: Diagnosis not present

## 2020-06-24 MED ORDER — AMLODIPINE BESYLATE 10 MG PO TABS: 10.0000 mg | ORAL_TABLET | Freq: Every day | ORAL | 3 refills | Status: DC

## 2020-06-24 NOTE — Progress Notes (Signed)
Established Patient Office Visit  Subjective:  Patient ID: Bryce Kane, male    DOB: [birth year]  Age: 70 y.o. MRN: [id number]  CC:  Chief Complaint  Patient presents with  . Blood Pressure Check    HPI Mr. Bryce Kane is a 70 year old male who presents for management of HTN. Denies  headaches, chest pain or lower extremity edema, sudden onset, vision changes, unilateral weakness, dizziness, paresthesias. He has shortness of breath with exertion.   States takes his medication daily. He did admit to having bbq baby back ribs today.   Past Medical History:  Diagnosis Date  . Hypertension     Past Surgical History:  Procedure Laterality Date  . HERNIA REPAIR      Family History  Family history unknown: Yes    Social History   Socioeconomic History  . Marital status: Married    Spouse name: Not on file  . Number of children: Not on file  . Years of education: Not on file  . Highest education level: Not on file  Occupational History  . Not on file  Tobacco Use  . Smoking status: Never Smoker  . Smokeless tobacco: Never Used  Vaping Use  . Vaping Use: Never used  Substance and Sexual Activity  . Alcohol use: No  . Drug use: No  . Sexual activity: Not Currently  Other Topics Concern  . Not on file  Social History Narrative  . Not on file   Social Determinants of Health   Financial Resource Strain: Not on file  Food Insecurity: Not on file  Transportation Needs: Not on file  Physical Activity: Not on file  Stress: Not on file  Social Connections: Not on file  Intimate Partner Violence: Not on file    Outpatient Medications Prior to Visit  Medication Sig Dispense Refill  . naproxen (NAPROSYN) 500 MG tablet Take 1 tablet (500 mg total) by mouth 2 (two) times daily with a meal. 30 tablet 0  . olopatadine (PATANOL) 0.1 % ophthalmic solution Place 1 drop into the left eye 2 (two) times daily. 5 mL 12  . valsartan-hydrochlorothiazide (DIOVAN HCT) 320-25 MG  tablet Take 1 tablet by mouth daily. 90 tablet 1  . Blood Pressure Monitor KIT 1 kit by Does not apply route 3 (three) times daily as needed. (Patient not taking: Reported on 06/24/2020) 1 kit 0  . diclofenac Sodium (VOLTAREN) 1 % GEL Apply 4 g topically 4 (four) times daily. (Patient not taking: Reported on 06/24/2020) 350 g 1  . erythromycin ophthalmic ointment Place a 1/2 inch ribbon of ointment into the lower eyelid. 3.5 g 0  . gabapentin (NEURONTIN) 100 MG capsule Take 1 capsule (100 mg total) by mouth 3 (three) times daily. (Patient not taking: Reported on 06/24/2020) 90 capsule 3  . melatonin (MELATONIN MAXIMUM STRENGTH) 5 MG TABS Take 1 tablet (5 mg total) by mouth at bedtime as needed. (Patient not taking: Reported on 06/24/2020) 90 tablet 0  . omeprazole (PRILOSEC) 20 MG capsule Take 1 capsule (20 mg total) by mouth daily. (Patient not taking: Reported on 04/19/2019) 14 capsule 0   No facility-administered medications prior to visit.    No Known Allergies  ROS Review of Systems  HENT: Positive for hearing loss.   Respiratory: Positive for shortness of breath.   Psychiatric/Behavioral: Positive for sleep disturbance.  All other systems reviewed and are negative.     Objective:    Physical Exam Vitals reviewed.  Constitutional:  Appearance: Normal appearance.  HENT:     Head: Normocephalic.     Right Ear: Tympanic membrane normal.     Left Ear: Tympanic membrane normal.     Nose: Nose normal.  Eyes:     Extraocular Movements: Extraocular movements intact.  Cardiovascular:     Rate and Rhythm: Normal rate and regular rhythm.  Pulmonary:     Effort: Pulmonary effort is normal.     Breath sounds: Normal breath sounds.  Abdominal:     General: Bowel sounds are normal.     Palpations: Abdomen is soft.  Musculoskeletal:        General: Normal range of motion.     Cervical back: Normal range of motion.  Skin:    General: Skin is warm and dry.  Neurological:     Mental  Status: He is alert and oriented to person, place, and time.  Psychiatric:        Mood and Affect: Mood normal.        Behavior: Behavior normal.        Thought Content: Thought content normal.        Judgment: Judgment normal.     BP 129/88 (BP Location: Right Arm, Patient Position: Sitting, Cuff Size: Normal)   Pulse 89   Temp (!) 97.5 F (36.4 C) (Temporal)   Ht _0  (1.778 m)   Wt 189 lb 3.2 oz (85.8 kg)   SpO2 99%   BMI 27.15 kg/m  Wt Readings from Last 3 Encounters:  06/24/20 189 lb 3.2 oz (85.8 kg)  05/15/20 187 lb 6.4 oz (85 kg)  07/14/19 198 lb (89.8 kg)     Health Maintenance Due  Topic Date Due  . COVID-19 Vaccine (1) Never done  . TETANUS/TDAP  Never done  . COLONOSCOPY (Pts 45-65yr Insurance coverage will need to be confirmed)  Never done  . PNA vac Low Risk Adult (1 of 2 - PCV13) Never done    There are no preventive care reminders to display for this patient.  Lab Results  Component Value Date   TSH 0.976 02/03/2018   Lab Results  Component Value Date   WBC 5.1 04/19/2019   HGB 13.5 04/19/2019   HCT 40.1 04/19/2019   MCV 87 04/19/2019   PLT 258 04/19/2019   Lab Results  Component Value Date   NA 144 04/19/2019   K 4.5 04/19/2019   CO2 24 04/19/2019   GLUCOSE 105 (H) 04/19/2019   BUN 14 04/19/2019   CREATININE 1.20 04/19/2019   BILITOT 0.7 04/19/2019   ALKPHOS 68 04/19/2019   AST 18 04/19/2019   ALT 17 04/19/2019   PROT 7.3 04/19/2019   ALBUMIN 4.4 04/19/2019   CALCIUM 9.4 04/19/2019   ANIONGAP 9 12/28/2017   Lab Results  Component Value Date   CHOL 139 04/19/2019   Lab Results  Component Value Date   HDL 40 04/19/2019   Lab Results  Component Value Date   LDLCALC 78 04/19/2019   Lab Results  Component Value Date   TRIG 114 04/19/2019   Lab Results  Component Value Date   CHOLHDL 3.5 04/19/2019   No results found for: HGBA1C    Assessment & Plan:  TOluwadamilolawas seen today for blood pressure check.  Diagnoses and  all orders for this visit:  Hypertension, unspecified type Blood pressure goal of less than 140/90 at goal with dual therapy ,valsartan-hydrochlorothiazide (DIOVAN HCT) 320-25 MG tablet low-sodium, DASH diet, medication compliance, exercise as tolerated.  Discussed medication compliance, adverse effects.  Insomnia, unspecified type  Insomnia Onset: Pattern:    Difficulty going to sleep: no    Frequent awakening:yes    Early awakening: yes Nightmares: Abnormal leg movement: no Snoring:no Apnea: Risk factors/sleep hygiene:    Stimulants:no    Alcohol intake:no    Reading, watching TV, eating @ bedtime:    Daytime naps: yes    Stress/anxiety: no    Information for insomnia on AVS  Grief/depression Risk for increase in anxiety, depression and grief- Son last Friday was changing a tire on the side of the road due to head trauma died. Advised if he had any problems schedule appointment for depression   Follow-up: Return in about 6 months (around 12/25/2020) for Bp f/u .    Kerin Perna, NP

## 2020-06-24 NOTE — Patient Instructions (Signed)
Can try melatonin 5mg-15 mg at night for sleep, can also do benadryl 25-50mg at night for sleep.  If this does not help we can try prescription medication.  Also here is some information about good sleep hygiene.   Insomnia Insomnia is frequent trouble falling and/or staying asleep. Insomnia can be a long term problem or a short term problem. Both are common. Insomnia can be a short term problem when the wakefulness is related to a certain stress or worry. Long term insomnia is often related to ongoing stress during waking hours and/or poor sleeping habits. Overtime, sleep deprivation itself can make the problem worse. Every little thing feels more severe because you are overtired and your ability to cope is decreased. CAUSES  Stress, anxiety, and depression. Poor sleeping habits. Distractions such as TV in the bedroom. Naps close to bedtime. Engaging in emotionally charged conversations before bed. Technical reading before sleep. Alcohol and other sedatives. They may make the problem worse. They can hurt normal sleep patterns and normal dream activity. Stimulants such as caffeine for several hours prior to bedtime. Pain syndromes and shortness of breath can cause insomnia. Exercise late at night. Changing time zones may cause sleeping problems (jet lag). It is sometimes helpful to have someone observe your sleeping patterns. They should look for periods of not breathing during the night (sleep apnea). They should also look to see how long those periods last. If you live alone or observers are uncertain, you can also be observed at a sleep clinic where your sleep patterns will be professionally monitored. Sleep apnea requires a checkup and treatment. Give your caregivers your medical history. Give your caregivers observations your family has made about your sleep.  SYMPTOMS  Not feeling rested in the morning. Anxiety and restlessness at bedtime. Difficulty falling and staying asleep. TREATMENT   Your caregiver may prescribe treatment for an underlying medical disorders. Your caregiver can give advice or help if you are using alcohol or other drugs for self-medication. Treatment of underlying problems will usually eliminate insomnia problems. Medications can be prescribed for short time use. They are generally not recommended for lengthy use. Over-the-counter sleep medicines are not recommended for lengthy use. They can be habit forming. You can promote easier sleeping by making lifestyle changes such as: Using relaxation techniques that help with breathing and reduce muscle tension. Exercising earlier in the day. Changing your diet and the time of your last meal. No night time snacks. Establish a regular time to go to bed. Counseling can help with stressful problems and worry. Soothing music and white noise may be helpful if there are background noises you cannot remove. Stop tedious detailed work at least one hour before bedtime. HOME CARE INSTRUCTIONS  Keep a diary. Inform your caregiver about your progress. This includes any medication side effects. See your caregiver regularly. Take note of: Times when you are asleep. Times when you are awake during the night. The quality of your sleep. How you feel the next day. This information will help your caregiver care for you. Get out of bed if you are still awake after 15 minutes. Read or do some quiet activity. Keep the lights down. Wait until you feel sleepy and go back to bed. Keep regular sleeping and waking hours. Avoid naps. Exercise regularly. Avoid distractions at bedtime. Distractions include watching television or engaging in any intense or detailed activity like attempting to balance the household checkbook. Develop a bedtime ritual. Keep a familiar routine of bathing, brushing your teeth,   climbing into bed at the same time each night, listening to soothing music. Routines increase the success of falling to sleep faster. Use  relaxation techniques. This can be using breathing and muscle tension release routines. It can also include visualizing peaceful scenes. You can also help control troubling or intruding thoughts by keeping your mind occupied with boring or repetitive thoughts like the old concept of counting sheep. You can make it more creative like imagining planting one beautiful flower after another in your backyard garden. During your day, work to eliminate stress. When this is not possible use some of the previous suggestions to help reduce the anxiety that accompanies stressful situations. MAKE SURE YOU:  Understand these instructions. Will watch your condition. Will get help right away if you are not doing well or get worse. Document Released: 03/13/2000 Document Revised: 06/08/2011 Document Reviewed: 04/13/2007 [employer] Patient Information 2015 [employer]. This information is not intended to replace advice given to you by your health care provider. Make sure you discuss any questions you have with your health care provider.  

## 2020-10-14 ENCOUNTER — Encounter (INDEPENDENT_AMBULATORY_CARE_PROVIDER_SITE_OTHER): Payer: Self-pay | Admitting: Primary Care

## 2020-10-14 ENCOUNTER — Ambulatory Visit (INDEPENDENT_AMBULATORY_CARE_PROVIDER_SITE_OTHER): Payer: Medicare Other | Admitting: Primary Care

## 2020-10-14 ENCOUNTER — Other Ambulatory Visit: Payer: Self-pay

## 2020-10-14 VITALS — BP 126/84 | HR 79 | Temp 97.7°F | Ht 70.0 in | Wt 177.0 lb

## 2020-10-14 DIAGNOSIS — I10 Essential (primary) hypertension: Secondary | ICD-10-CM | POA: Diagnosis not present

## 2020-10-14 DIAGNOSIS — Z0000 Encounter for general adult medical examination without abnormal findings: Secondary | ICD-10-CM

## 2020-10-14 DIAGNOSIS — G4700 Insomnia, unspecified: Secondary | ICD-10-CM | POA: Diagnosis not present

## 2020-10-14 MED ORDER — VALSARTAN-HYDROCHLOROTHIAZIDE 80-12.5 MG PO TABS: 1.0000 | ORAL_TABLET | Freq: Every day | ORAL | 0 refills | Status: DC

## 2020-10-14 NOTE — Patient Instructions (Signed)
Can try melatonin 5mg  at night for sleep, can also do benadryl 12.5-25mg  at night for sleep.  If this does not help we can try prescription medication.  Also here is some information about good sleep hygiene.   Insomnia Insomnia is frequent trouble falling and/or staying asleep. Insomnia can be a long term problem or a short term problem. Both are common. Insomnia can be a short term problem when the wakefulness is related to a certain stress or worry. Long term insomnia is often related to ongoing stress during waking hours and/or poor sleeping habits. Overtime, sleep deprivation itself can make the problem worse. Every little thing feels more severe because you are overtired and your ability to cope is decreased. CAUSES  Stress, anxiety, and depression. Poor sleeping habits. Distractions such as TV in the bedroom. Naps close to bedtime. Engaging in emotionally charged conversations before bed. Technical reading before sleep. Alcohol and other sedatives. They may make the problem worse. They can hurt normal sleep patterns and normal dream activity. Stimulants such as caffeine for several hours prior to bedtime. Pain syndromes and shortness of breath can cause insomnia. Exercise late at night. Changing time zones may cause sleeping problems (jet lag). It is sometimes helpful to have someone observe your sleeping patterns. They should look for periods of not breathing during the night (sleep apnea). They should also look to see how long those periods last. If you live alone or observers are uncertain, you can also be observed at a sleep clinic where your sleep patterns will be professionally monitored. Sleep apnea requires a checkup and treatment. Give your caregivers your medical history. Give your caregivers observations your family has made about your sleep.  SYMPTOMS  Not feeling rested in the morning. Anxiety and restlessness at bedtime. Difficulty falling and staying asleep. TREATMENT   Your caregiver may prescribe treatment for an underlying medical disorders. Your caregiver can give advice or help if you are using alcohol or other drugs for self-medication. Treatment of underlying problems will usually eliminate insomnia problems. Medications can be prescribed for short time use. They are generally not recommended for lengthy use. Over-the-counter sleep medicines are not recommended for lengthy use. They can be habit forming. You can promote easier sleeping by making lifestyle changes such as: Using relaxation techniques that help with breathing and reduce muscle tension. Exercising earlier in the day. Changing your diet and the time of your last meal. No night time snacks. Establish a regular time to go to bed. Counseling can help with stressful problems and worry. Soothing music and white noise may be helpful if there are background noises you cannot remove. Stop tedious detailed work at least one hour before bedtime. HOME CARE INSTRUCTIONS  Keep a diary. Inform your caregiver about your progress. This includes any medication side effects. See your caregiver regularly. Take note of: Times when you are asleep. Times when you are awake during the night. The quality of your sleep. How you feel the next day. This information will help your caregiver care for you. Get out of bed if you are still awake after 15 minutes. Read or do some quiet activity. Keep the lights down. Wait until you feel sleepy and go back to bed. Keep regular sleeping and waking hours. Avoid naps. Exercise regularly. Avoid distractions at bedtime. Distractions include watching television or engaging in any intense or detailed activity like attempting to balance the household checkbook. Develop a bedtime ritual. Keep a familiar routine of bathing, brushing your teeth, climbing  into bed at the same time each night, listening to soothing music. Routines increase the success of falling to sleep faster. Use  relaxation techniques. This can be using breathing and muscle tension release routines. It can also include visualizing peaceful scenes. You can also help control troubling or intruding thoughts by keeping your mind occupied with boring or repetitive thoughts like the old concept of counting sheep. You can make it more creative like imagining planting one beautiful flower after another in your backyard garden. During your day, work to eliminate stress. When this is not possible use some of the previous suggestions to help reduce the anxiety that accompanies stressful situations. MAKE SURE YOU:  Understand these instructions. Will watch your condition. Will get help right away if you are not doing well or get worse. Document Released: 03/13/2000 Document Revised: 06/08/2011 Document Reviewed: 04/13/2007 [facility] Patient Information 2015 [place], [place]. This information is not intended to replace advice given to you by your health care provider. Make sure you discuss any questions you have with your health care provider.

## 2020-10-14 NOTE — Progress Notes (Signed)
Bp medications makes him feel drunk  Would like something to help him sleep

## 2020-10-14 NOTE — Addendum Note (Signed)
Addended by: Gwinda Passe on: 10/14/2020 03:41 PM   Modules accepted: Level of Service

## 2020-10-14 NOTE — Progress Notes (Addendum)
Subjective:   Bryce Kane is a 70 y.o. male who presents for an Initial Medicare Annual Wellness Visit.  Review of Systems    Review of Systems  HENT:  Positive for hearing loss.        Wears hearing aid in right ear  Neurological:  Positive for dizziness.       Drunk feeling takin BP medication - Bp wnl  Psychiatric/Behavioral:  The patient has insomnia.        Sleeps all day-  All other systems reviewed and are negative.        Objective:    Today's Vitals   10/14/20 1445  BP: 126/84  Pulse: 79  Temp: 97.7 F (36.5 C)  TempSrc: Temporal  SpO2: 100%  Weight: 177 lb (80.3 kg)  Height: 5\' 10"  (1.778 m)  PainSc: 0-No pain   Body mass index is 25.4 kg/m.  Advanced Directives 07/14/2019 12/28/2017 11/22/2016 10/30/2014  Does Patient Have a Medical Advance Directive? No No No No  Would patient like information on creating a medical advance directive? No - Patient declined No - Patient declined - No - patient declined information    Current Medications (verified) Outpatient Encounter Medications as of 10/14/2020  Medication Sig   Blood Pressure Monitor KIT 1 kit by Does not apply route 3 (three) times daily as needed. (Patient not taking: No sig reported)   diclofenac Sodium (VOLTAREN) 1 % GEL Apply 4 g topically 4 (four) times daily. (Patient not taking: No sig reported)   naproxen (NAPROSYN) 500 MG tablet Take 1 tablet (500 mg total) by mouth 2 (two) times daily with a meal. (Patient not taking: Reported on 10/14/2020)   olopatadine (PATANOL) 0.1 % ophthalmic solution Place 1 drop into the left eye 2 (two) times daily.   valsartan-hydrochlorothiazide (DIOVAN HCT) 320-25 MG tablet Take 1 tablet by mouth daily. (Patient not taking: Reported on 10/14/2020)   No facility-administered encounter medications on file as of 10/14/2020.    Allergies (verified) Patient has no known allergies.   History: Past Medical History:  Diagnosis Date   Hypertension    Past Surgical  History:  Procedure Laterality Date   HERNIA REPAIR     Family History  Family history unknown: Yes   Social History   Socioeconomic History   Marital status: Married    Spouse name: Not on file   Number of children: Not on file   Years of education: Not on file   Highest education level: Not on file  Occupational History   Not on file  Tobacco Use   Smoking status: Never   Smokeless tobacco: Never  Vaping Use   Vaping Use: Never used  Substance and Sexual Activity   Alcohol use: No   Drug use: No   Sexual activity: Not Currently  Other Topics Concern   Not on file  Social History Narrative   Not on file   Social Determinants of Health   Financial Resource Strain: Not on file  Food Insecurity: Not on file  Transportation Needs: Not on file  Physical Activity: Not on file  Stress: Not on file  Social Connections: Not on file    Tobacco Counseling Counseling given: Does not smoke    Clinical Intake:  Pre-visit preparation completed: Yes  Pain : No/denies pain Pain Score: 0-No pain     Diabetes: No  How often do you need to have someone help you when you read instructions, pamphlets, or other written materials from your  doctor or pharmacy?: 1 - Never  Diabetic?No  Interpreter Needed?: No  Activities of Daily Living Needs assistance with IADL's, daughters cook, he is able to dress and feed self , ambulate and answer question appropriately   Patient Care Team: Kerin Perna, NP as PCP - General (Internal Medicine)  Indicate any recent Medical Services you may have received from other than [facility] providers in the past year (date may be approximate).     Assessment:   This is a routine wellness examination for [place].  Hearing/Vision screen Hearing lost wears a hear aid in right ear and still hears very little   Dietary issues and exercise activities discussed: None family prepares food and he walks     Goals Addressed   None     Depression Screen PHQ 2/9 Scores 10/14/2020 06/24/2020 05/15/2020 06/05/2019 04/19/2019 05/24/2018 02/03/2018  PHQ - 2 Score - 0 0 0 0 0 0  PHQ- 9 Score - - - - - - 2  Exception Documentation Patient refusal - - - - - -    Fall Risk Fall Risk  10/14/2020 06/24/2020 05/15/2020 06/05/2019 04/19/2019  Falls in the past year? 0 0 0 0 0    FALL RISK PREVENTION PERTAINING TO THE HOME:  Any stairs in or around the home? No  If so, are there any without handrails?  none Home free of loose throw rugs in walkways, pet beds, electrical cords, etc? No  Adequate lighting in your home to reduce risk of falls? Yes   ASSISTIVE DEVICES UTILIZED TO PREVENT FALLS:  Life alert? No  Use of a cane, walker or w/c? No  Grab bars in the bathroom? No  Shower chair or bench in shower? No  Elevated toilet seat or a handicapped toilet? No   TIMED UP AND GO:  Was the test performed? No .    Gait slow and steady without use of assistive device  Cognitive Function: Question if can read - felt yelling at him to answer questions than handed him a piece of paper asking can you cook, bathe your self, feed yours self pay your own bills- PCP had to go back to raising voice to hear  Unable to complete  Immunizations  There is no immunization history on file for this patient.  TDAP status: Up to date  Flu Vaccine status: Up to date  Pneumococcal vaccine status: Up to date  Covid-19 vaccine status: Completed vaccines  Qualifies for Shingles Vaccine? Yes   Zostavax completed Yes   Vitals:   10/14/20 1445  BP: 126/84  Pulse: 79  Temp: 97.7 F (36.5 C)  TempSrc: Temporal  SpO2: 100%  Weight: 177 lb (80.3 kg)  Height: $Remove'5\' 10"'PFGWMIt$  (1.778 m)   General: Vital signs reviewed.  Patient is well-developed and well-nourished, male (Body mass index is 25.4 kg/m. )in no acute distress and cooperative with exam.  Head: Normocephalic and atraumatic. Eyes: EOMI, conjunctivae normal, no scleral icterus.  Neck: Supple,  trachea midline, normal ROM, no JVD, masses, thyromegaly, or carotid bruit present.  Cardiovascular: RRR, S1 normal, S2 normal, no murmurs, gallops, or rubs. Pulmonary/Chest: Clear to auscultation bilaterally, no wheezes, rales, or rhonchi. Abdominal: Soft, non-tender, non-distended, BS +, no masses, organomegaly, or guarding present.  Musculoskeletal: No joint deformities, erythema, or stiffness, ROM full and nontender. Extremities: No lower extremity edema bilaterally,  pulses symmetric and intact bilaterally. No cyanosis or clubbing. Neurological: A&O x3, Strength is normal and symmetric bilaterally, no focal motor deficit, sensory intact to  light touch bilaterally.  Skin: Warm, dry and intact. No rashes or erythema. Psychiatric: Normal mood and affect. speech and behavior is normal. Cognition and memory not tested   Screening Tests Health Maintenance  Topic Date Due   COVID-19 Vaccine (1) Never done   COLONOSCOPY (Pts 45-73yrs Insurance coverage will need to be confirmed)  Never done   Zoster Vaccines- Shingrix (1 of 2) 01/14/2021 (Originally 03/05/1999)   TETANUS/TDAP  10/14/2021 (Originally 03/04/1970)   PNA vac Low Risk Adult (1 of 2 - PCV13) 10/14/2021 (Originally 03/04/2014)   INFLUENZA VACCINE  10/28/2020   Hepatitis C Screening  Completed   HPV VACCINES  Aged Out    Health Maintenance  Health Maintenance Due  Topic Date Due   COVID-19 Vaccine (1) Never done   COLONOSCOPY (Pts 45-73yrs Insurance coverage will need to be confirmed)  Never done    Colorectal cancer screening: Referral to GI placed  . Pt aware the office will call re: appt.  Lung Cancer Screening: (Low Dose CT Chest recommended if Age 10-80 years, 30 pack-year currently smoking OR have quit w/in 15years.) does not qualify.   Lung Cancer Screening Referral: None  Additional Screening:  Hepatitis C Screening: does not qualify;  Vision Screening: Recommended annual ophthalmology exams for early detection of  glaucoma and other disorders of the eye. Is the patient up to date with their annual eye exam?  No  Who is the provider or what is the name of the office in which the patient attends annual eye exams? no If pt is not established with a provider, would they like to be referred to a provider to establish care? Yes .   Dental Screening: Recommended annual dental exams for proper oral hygiene  Community Resource Referral / Chronic Care Management: CRR required this visit?  No   CCM required this visit?  No      Plan:    Ora was seen today for annual exam.  Diagnoses and all orders for this visit:  Encounter for initial preventive physical examination covered by Medicare Completed   Insomnia, unspecified type Do not take naps and sleep during the day time - instructions on AVS  Hypertension, unspecified type Medication was too strong breaking in half not scored tried to explain could not do that. New prescription sent in and took current pills. -     valsartan-hydrochlorothiazide (DIOVAN-HCT) 80-12.5 MG tablet; Take 1 tablet by mouth daily.   Bp at goal continue low-sodium, DASH diet, medication compliance, 150 minutes of moderate intensity exercise per week. Discussed medication compliance, adverse effects.   I have personally reviewed and noted the following in the patient's chart:   Medical and social history Use of alcohol, tobacco or illicit drugs  Current medications and supplements including opioid prescriptions. Patient is not currently taking opioid prescriptions. Functional ability and status Nutritional status Physical activity Advanced directives List of other physicians Hospitalizations, surgeries, and ER visits in previous 12 months Vitals Screenings to include cognitive, depression, and falls Referrals and appointments  In addition, I have reviewed and discussed with patient certain preventive protocols, quality metrics, and best practice recommendations. A  written personalized care plan for preventive services as well as general preventive health recommendations were provided to patient.     Kerin Perna, NP   10/14/2020   Nurse Notes:

## 2020-12-25 ENCOUNTER — Ambulatory Visit (INDEPENDENT_AMBULATORY_CARE_PROVIDER_SITE_OTHER): Payer: Medicare Other | Admitting: Primary Care

## 2021-03-25 ENCOUNTER — Other Ambulatory Visit: Payer: Self-pay | Admitting: Family Medicine

## 2021-03-25 ENCOUNTER — Ambulatory Visit
Admission: RE | Admit: 2021-03-25 | Discharge: 2021-03-25 | Disposition: A | Discharge status: Home / Self Care | Payer: Medicare Other | Source: Ambulatory Visit | Attending: Family Medicine | Admitting: Family Medicine

## 2021-03-25 DIAGNOSIS — M25512 Pain in left shoulder: Secondary | ICD-10-CM

## 2021-04-25 ENCOUNTER — Other Ambulatory Visit: Payer: Self-pay | Admitting: Surgical Oncology

## 2021-04-25 ENCOUNTER — Other Ambulatory Visit: Payer: Self-pay | Admitting: Family Medicine

## 2022-03-18 IMAGING — CR DG SHOULDER 2+V*L*
3 series · 3 of 3 positions shown · non-contrast
Comparison: None

CLINICAL DATA: A 70-year-old male presents for evaluation of
chronic shoulder pain, no history of injury.

EXAM:
LEFT SHOULDER - 2+ VIEW

[w shoulder ap internal left]
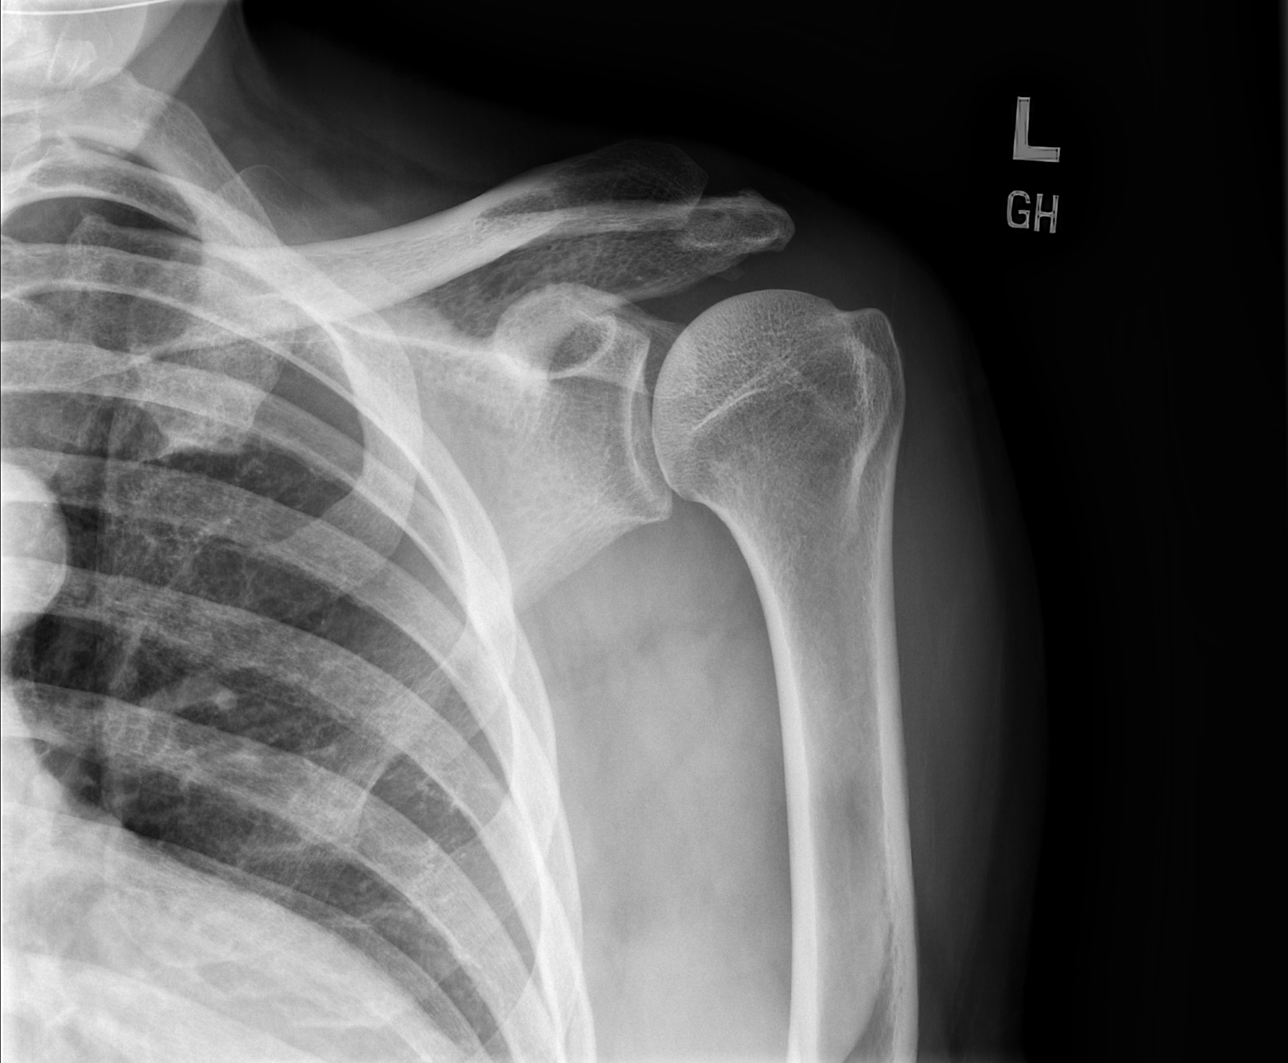

[w shoulder y view left]
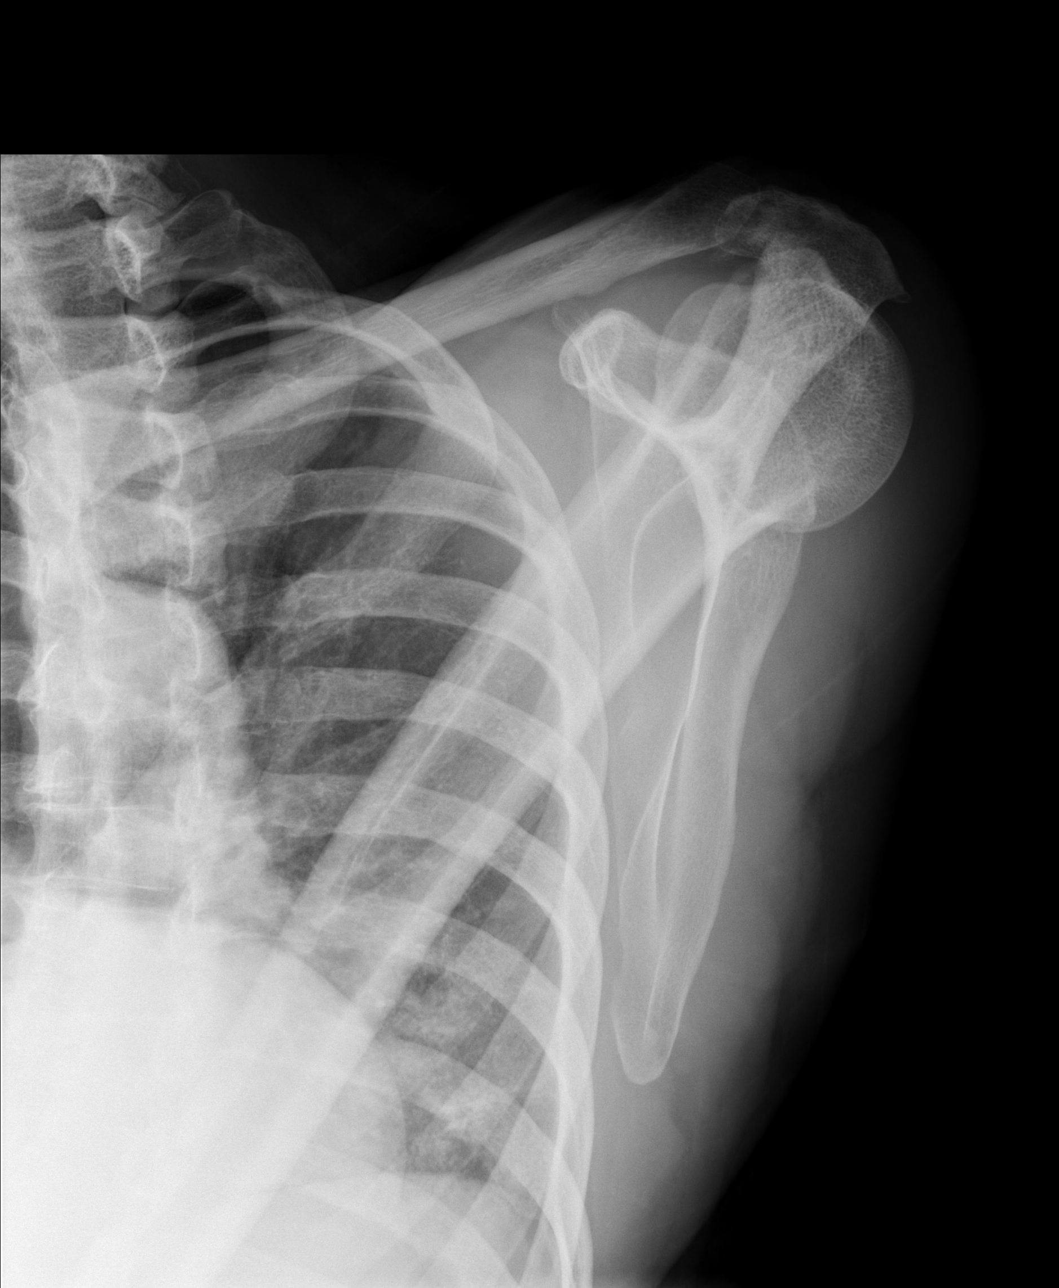

[w shoulder axillary left *]
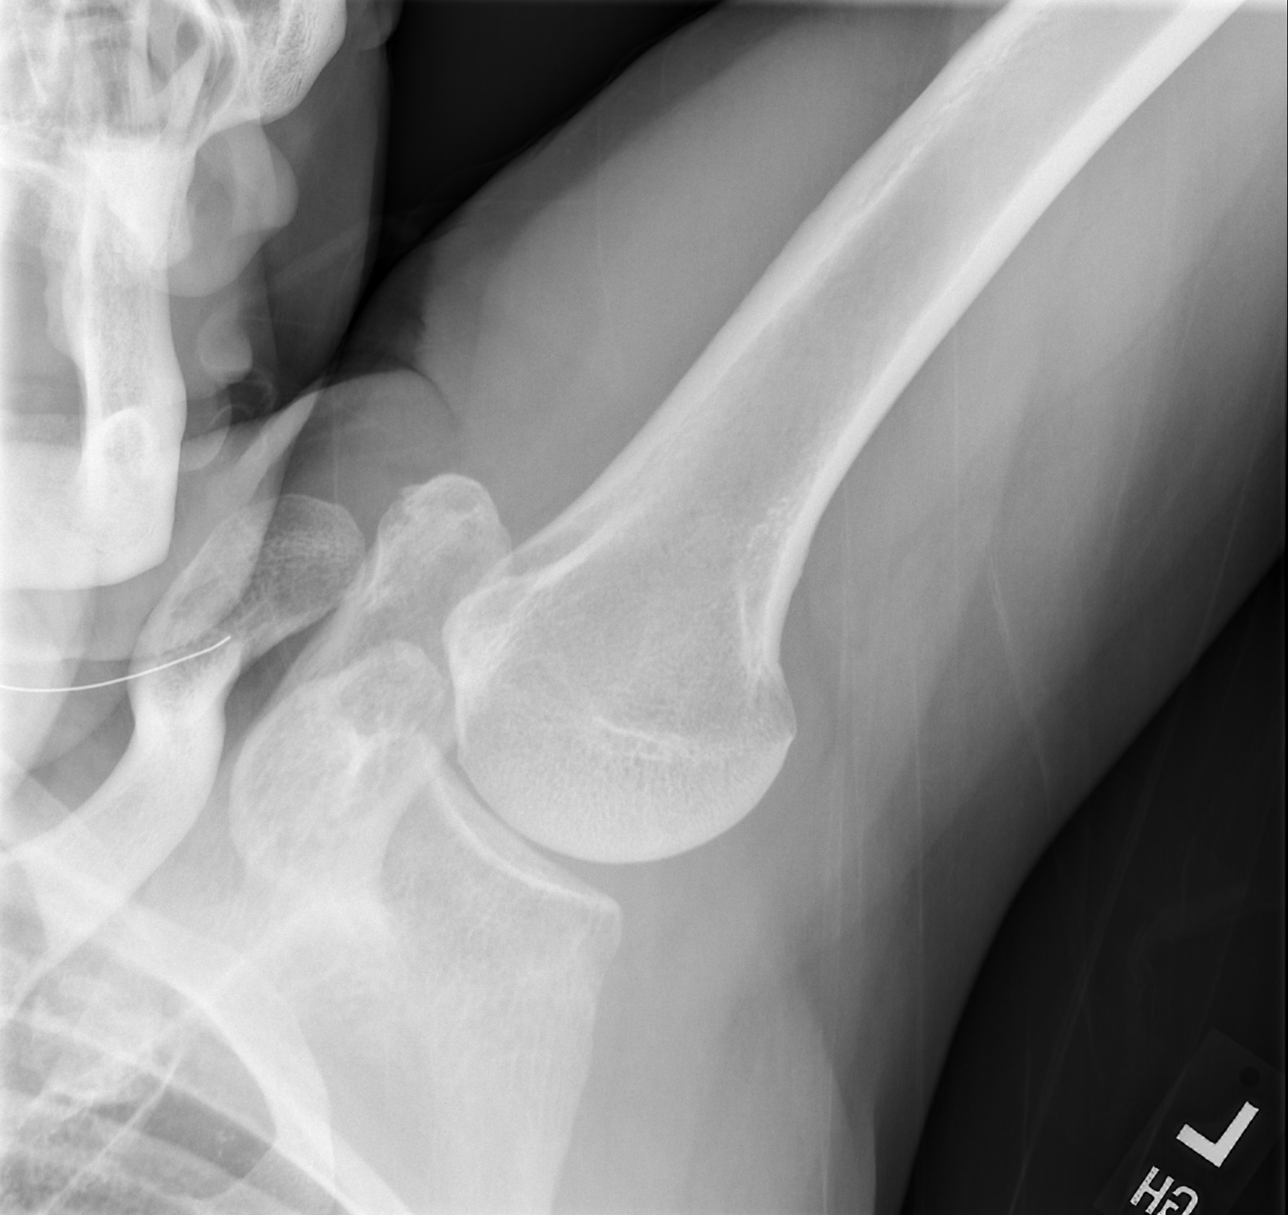

[3 of 3 positions shown; findings below may reference images not displayed]

FINDINGS: There is no evidence of fracture or dislocation. There is no
evidence of arthropathy or other focal bone abnormality. Soft
tissues are unremarkable.
IMPRESSION: Negative.

## 2022-06-07 ENCOUNTER — Ambulatory Visit (HOSPITAL_COMMUNITY)
Admission: EM | Admit: 2022-06-07 | Discharge: 2022-06-07 | Disposition: A | Discharge status: Home / Self Care | Payer: Medicare HMO | Attending: Nurse Practitioner | Admitting: Nurse Practitioner

## 2022-06-07 ENCOUNTER — Encounter (HOSPITAL_COMMUNITY): Payer: Self-pay

## 2022-06-07 DIAGNOSIS — M722 Plantar fascial fibromatosis: Secondary | ICD-10-CM

## 2022-06-07 DIAGNOSIS — I10 Essential (primary) hypertension: Secondary | ICD-10-CM | POA: Diagnosis not present

## 2022-06-07 MED ORDER — MELOXICAM 15 MG PO TABS: 15.0000 mg | ORAL_TABLET | Freq: Every day | ORAL | 0 refills | Status: DC

## 2022-06-07 NOTE — Discharge Instructions (Addendum)
Your foot pain is likley due to a condition called plantar fasciitis. Please read the attached instructions for information about this diagnosis  Take medications as prescribed  Do not take any over-the-counter NSAIDs (aleve, ibuprofen, naproxen, advil) while on the meloxicam  You can take tylenol in addition to the prescribed medications if needed Do some of the exercises that are attached at least twice a day  Make sure you wear good supporting shoes  Try soft shoe inserts that are designed for plantar fasciitis.  Your blood pressure was elevated today. You report that you no longer on blood pressure medication. Please have your BP re-checked in a couple of days. You may need to go back on medications if your blood pressure readings continue to be elevated.

## 2022-06-07 NOTE — ED Triage Notes (Signed)
Pt presents to office for bilateral foot pain for several months.

## 2022-06-07 NOTE — ED Provider Notes (Signed)
[place]    CSN: [id number] Arrival date & time: 06/07/22  1006      History   Chief Complaint Chief Complaint  Patient presents with   Foot Pain    HPI Bryce Kane is a 72 y.o. male.   History of Present Illness  Bryce Kane is a 72 y.o. male that complains of pain in the plantar aspect of the left foot.. Onset of symptoms was gradual starting several months ago. Patient describes pain as aching. The pain is intermittent and occurs to both feet. Currently patient is complaining of symptoms to just the left foot. Pain severity is 5 /10. Pain is aggravated by weight bearing and prolonged ambulation. The pain is worse in the morning and seems to get some better throughout the day. He denies any numbness, tingling, weakness, loss of sensation, loss of motion, or inability to bear weight to the feet. The patient denies other symptoms. He denies history of neuropathy. He has tried ibuprofen, tylenol and Voltaren gel with no relief in symptoms.        Past Medical History:  Diagnosis Date   Hypertension     There are no problems to display for this patient.   Past Surgical History:  Procedure Laterality Date   HERNIA REPAIR         Home Medications    Prior to Admission medications   Medication Sig Start Date End Date Taking? Authorizing Provider  meloxicam (MOBIC) 15 MG tablet Take 1 tablet (15 mg total) by mouth daily. 06/07/22  Yes Enrique Sack, FNP    Family History Family History  Family history unknown: Yes    Social History Social History   Tobacco Use   Smoking status: Never   Smokeless tobacco: Never  Vaping Use   Vaping Use: Never used  Substance Use Topics   Alcohol use: No   Drug use: No     Allergies   Patient has no known allergies.   Review of Systems Review of Systems  Musculoskeletal:  Negative for gait problem, joint swelling and myalgias.       Bilateral foot pain   All other systems reviewed and are  negative.    Physical Exam Triage Vital Signs ED Triage Vitals  Enc Vitals Group     BP 06/07/22 1025 (!) 151/83     Pulse Rate 06/07/22 1025 88     Resp 06/07/22 1025 18     Temp 06/07/22 1025 98.1 F (36.7 C)     Temp Source 06/07/22 1025 Oral     SpO2 06/07/22 1025 98 %     Weight --      Height --      Head Circumference --      Peak Flow --      Pain Score 06/07/22 1027 5     Pain Loc --      Pain Edu? --      Excl. in [place]? --    No data found.  Updated Vital Signs BP (!) 151/83 (BP Location: Left Arm)   Pulse 88   Temp 98.1 F (36.7 C) (Oral)   Resp 18   SpO2 98%   Visual Acuity Right Eye Distance:   Left Eye Distance:   Bilateral Distance:    Right Eye Near:   Left Eye Near:    Bilateral Near:     Physical Exam Vitals reviewed.  Constitutional:      Appearance: Normal appearance.  HENT:  Head: Normocephalic.  Cardiovascular:     Pulses:          Dorsalis pedis pulses are 2+ on the right side and 2+ on the left side.  Musculoskeletal:        General: Normal range of motion.     Right foot: Normal range of motion and normal capillary refill. No swelling, deformity, bunion, tenderness or bony tenderness.     Left foot: Normal range of motion and normal capillary refill. No swelling, deformity, bunion, tenderness or bony tenderness.  Feet:     Right foot:     Skin integrity: Skin integrity normal.     Toenail Condition: Right toenails are normal.     Left foot:     Skin integrity: Skin integrity normal.     Toenail Condition: Left toenails are normal.     Comments: Normal strength, sensation and ROM of the feet  Skin:    General: Skin is warm and dry.  Neurological:     General: No focal deficit present.     Mental Status: He is alert and oriented to person, place, and time.      UC Treatments / Results  Labs (all labs ordered are listed, but only abnormal results are displayed) Labs Reviewed - No data to  display  EKG   Radiology No results found.  Procedures Procedures (including critical care time)  Medications Ordered in UC Medications - No data to display  Initial Impression / Assessment and Plan / UC Course  I have reviewed the triage vital signs and the nursing notes.  Pertinent labs & imaging results that were available during my care of the patient were reviewed by me and considered in my medical decision making (see chart for details).    72 yo male that presents with foot pain for several months. He's been having the pain intermittently to both feet but currently complains of left foot pain. I suspect that the patient's symptoms are due to plantar fascitis. Prescribing Mobic. Advised against OTC NSAIDs while on this therapy. Tylenol is acceptable to add if needed. Plantar fasciitis rehab exercised explained and provided. Patient advised to follow-up with PCP for orthopedic referral if symptoms continue. Patient also noted to be hypertensive with a BP of 151/83. He reports a past history of HTN but have been off antihypertensives for quite a while. No evidence of hypertensive urgency or emergency. Recommended that he has his BP rechecked in 3 days. Informed patient that he may need to go back on medications if his BP continues to be elevated.   Today's evaluation has revealed no signs of a dangerous process. Discussed diagnosis with patient and/or guardian. Patient and/or guardian aware of their diagnosis, possible red flag symptoms to watch out for and need for close follow up. Patient and/or guardian understands verbal and written discharge instructions. Patient and/or guardian comfortable with plan and disposition.  Patient and/or guardian has a clear mental status at this time, good insight into illness (after discussion and teaching) and has clear judgment to make decisions regarding their care  Documentation was completed with the aid of voice recognition software. Transcription  may contain typographical errors. Final Clinical Impressions(s) / UC Diagnoses   Final diagnoses:  Plantar fasciitis of left foot  Elevated blood pressure reading in office with diagnosis of hypertension     Discharge Instructions      Your foot pain is likley due to a condition called plantar fasciitis. Please read the attached instructions for  information about this diagnosis  Take medications as prescribed  Do not take any over-the-counter NSAIDs (aleve, ibuprofen, naproxen, advil) while on the meloxicam  You can take tylenol in addition to the prescribed medications if needed Do some of the exercises that are attached at least twice a day  Make sure you wear good supporting shoes  Try soft shoe inserts that are designed for plantar fasciitis.  Your blood pressure was elevated today. You report that you no longer on blood pressure medication. Please have your BP re-checked in a couple of days. You may need to go back on medications if your blood pressure readings continue to be elevated.      ED Prescriptions     Medication Sig Dispense Auth. Provider   meloxicam (MOBIC) 15 MG tablet Take 1 tablet (15 mg total) by mouth daily. 30 tablet Enrique Sack, FNP      PDMP not reviewed this encounter.   Enrique Sack, [place] 06/07/22 1135

## 2023-02-20 ENCOUNTER — Other Ambulatory Visit: Payer: Self-pay

## 2023-02-20 ENCOUNTER — Encounter (HOSPITAL_COMMUNITY): Payer: Self-pay | Admitting: *Deleted

## 2023-02-20 ENCOUNTER — Ambulatory Visit (HOSPITAL_COMMUNITY)
Admission: EM | Admit: 2023-02-20 | Discharge: 2023-02-20 | Disposition: A | Discharge status: Home / Self Care | Payer: Medicare HMO | Attending: Family Medicine | Admitting: Family Medicine

## 2023-02-20 DIAGNOSIS — R030 Elevated blood-pressure reading, without diagnosis of hypertension: Secondary | ICD-10-CM | POA: Diagnosis not present

## 2023-02-20 DIAGNOSIS — R1013 Epigastric pain: Secondary | ICD-10-CM | POA: Diagnosis not present

## 2023-02-20 DIAGNOSIS — K2970 Gastritis, unspecified, without bleeding: Secondary | ICD-10-CM

## 2023-02-20 MED ORDER — PANTOPRAZOLE SODIUM 20 MG PO TBEC: 20.0000 mg | DELAYED_RELEASE_TABLET | Freq: Every day | ORAL | 0 refills | Status: DC

## 2023-02-20 NOTE — Discharge Instructions (Signed)
It was nice seeing you today. I have sent medication for reflux to your pharmacy. Let us trial this and see if you pain will improve. Please follow up with your PCP soon. They also need to monitor your blood pressure.

## 2023-02-20 NOTE — ED Provider Notes (Signed)
[facility]    CSN: [id number] Arrival date & time: 02/20/23  1005      History   Chief Complaint Chief Complaint  Patient presents with   Abdominal Pain    HPI Bryce Kane is a 72 y.o. male.   The history is provided by the patient. No language interpreter was used.  Abdominal Pain Pain location:  Epigastric (Started a while back, but worsened in the last few days) Pain quality: aching   Pain radiation: Pain does not go anywhere, but sometimes, it gives him pain in his left hip. Pain severity:  Moderate Onset quality:  Gradual Timing:  Intermittent Progression:  Waxing and waning Context: eating   Relieved by:  Nothing (Food avaoidance makes him better) Worsened by:  Eating Ineffective treatments:  None tried Associated symptoms: no anorexia, no constipation, no diarrhea, no hematochezia, no melena, no nausea and no vomiting   Associated symptoms comment:  Last bowel movement was this morning and it was normal.   Past Medical History:  Diagnosis Date   Hypertension     There are no problems to display for this patient.   Past Surgical History:  Procedure Laterality Date   HERNIA REPAIR         Home Medications    Prior to Admission medications   Medication Sig Start Date End Date Taking? Authorizing Provider  pantoprazole (PROTONIX) 20 MG tablet Take 1 tablet (20 mg total) by mouth daily. 02/20/23  Yes Doreene Eland, MD  meloxicam (MOBIC) 15 MG tablet Take 1 tablet (15 mg total) by mouth daily. 06/07/22   Lurline Idol, FNP    Family History Family History  Family history unknown: Yes    Social History Social History   Tobacco Use   Smoking status: Never   Smokeless tobacco: Never  Vaping Use   Vaping status: Never Used  Substance Use Topics   Alcohol use: No   Drug use: No     Allergies   Patient has no known allergies.   Review of Systems Review of Systems  Gastrointestinal:  Positive for abdominal pain.  Negative for anorexia, constipation, diarrhea, hematochezia, melena, nausea and vomiting.  All other systems reviewed and are negative.    Physical Exam Triage Vital Signs ED Triage Vitals  Encounter Vitals Group     BP      Systolic BP Percentile      Diastolic BP Percentile      Pulse      Resp      Temp      Temp src      SpO2      Weight      Height      Head Circumference      Peak Flow      Pain Score      Pain Loc      Pain Education      Exclude from Growth Chart    No data found.  Updated Vital Signs BP (!) 149/94 (BP Location: Right Arm)   Pulse 88   Temp 98.6 F (37 C)   Resp 20   SpO2 97%   Visual Acuity Right Eye Distance:   Left Eye Distance:   Bilateral Distance:    Right Eye Near:   Left Eye Near:    Bilateral Near:     Physical Exam Vitals and nursing note reviewed.  Cardiovascular:     Rate and Rhythm: Normal rate and regular rhythm.  Heart sounds: Normal heart sounds. No murmur heard. Pulmonary:     Effort: Pulmonary effort is normal. No respiratory distress.     Breath sounds: Normal breath sounds. No wheezing.  Abdominal:     General: Abdomen is flat. Bowel sounds are normal. There is no distension.     Palpations: Abdomen is soft. There is no mass.     Tenderness: There is no abdominal tenderness. There is no guarding or rebound.      UC Treatments / Results  Labs (all labs ordered are listed, but only abnormal results are displayed) Labs Reviewed - No data to display  EKG   Radiology No results found.  Procedures Procedures (including critical care time)  Medications Ordered in UC Medications - No data to display  Initial Impression / Assessment and Plan / UC Course  I have reviewed the triage vital signs and the nursing notes.  Pertinent labs & imaging results that were available during my care of the patient were reviewed by me and considered in my medical decision making (see chart for details).  Clinical  Course as of 02/20/23 1044  Sat Feb 20, 2023  1042 Epigastric abdominal pain Likely Gastritis/GERD Abdominal exam benign Trial PPI - Protonix escribed F/U with PCP in the coming week for reassessment if not improvement He agreed with the plan [KE]  1042 Elevated blood pressure Repeat BP done by me improved I advised him to f/u with PCP for BP reassessment and management He agreed with the plan [KE]    Clinical Course User Index [KE] Doreene Eland, MD    Final Clinical Impressions(s) / UC Diagnoses   Final diagnoses:  Epigastric pain  Gastritis without bleeding, unspecified chronicity, unspecified gastritis type  Elevated blood pressure reading     Discharge Instructions      It was nice seeing you today. I have sent medication for reflux to your pharmacy. Let us trial this and see if you pain will improve. Please follow up with your PCP soon. They also need to monitor your blood pressure.     ED Prescriptions     Medication Sig Dispense Auth. Provider   pantoprazole (PROTONIX) 20 MG tablet Take 1 tablet (20 mg total) by mouth daily. 30 tablet Doreene Eland, MD      PDMP not reviewed this encounter.   Doreene Eland, MD 02/20/23 1044

## 2023-02-20 NOTE — ED Triage Notes (Signed)
Pt reports ABD worse this past week . Pt reports stomach hurt after he eats and yesterday his stomach hurt when he pressed on it.

## 2023-07-09 ENCOUNTER — Ambulatory Visit (HOSPITAL_COMMUNITY)
Admission: EM | Admit: 2023-07-09 | Discharge: 2023-07-09 | Disposition: A | Discharge status: Home / Self Care | Attending: Nurse Practitioner | Admitting: Nurse Practitioner

## 2023-07-09 ENCOUNTER — Ambulatory Visit (INDEPENDENT_AMBULATORY_CARE_PROVIDER_SITE_OTHER)

## 2023-07-09 ENCOUNTER — Other Ambulatory Visit: Payer: Self-pay

## 2023-07-09 ENCOUNTER — Encounter (HOSPITAL_COMMUNITY): Payer: Self-pay | Admitting: *Deleted

## 2023-07-09 DIAGNOSIS — M545 Low back pain, unspecified: Secondary | ICD-10-CM

## 2023-07-09 DIAGNOSIS — M25511 Pain in right shoulder: Secondary | ICD-10-CM | POA: Diagnosis not present

## 2023-07-09 DIAGNOSIS — G8929 Other chronic pain: Secondary | ICD-10-CM

## 2023-07-09 DIAGNOSIS — M546 Pain in thoracic spine: Secondary | ICD-10-CM

## 2023-07-09 DIAGNOSIS — W19XXXA Unspecified fall, initial encounter: Secondary | ICD-10-CM

## 2023-07-09 MED ORDER — CYCLOBENZAPRINE HCL 10 MG PO TABS: 10.0000 mg | ORAL_TABLET | Freq: Every day | ORAL | 0 refills | Status: DC

## 2023-07-09 MED ORDER — METHOCARBAMOL 500 MG PO TABS: 500.0000 mg | ORAL_TABLET | Freq: Every morning | ORAL | 0 refills | Status: DC

## 2023-07-09 MED ORDER — NAPROXEN 500 MG PO TABS: 500.0000 mg | ORAL_TABLET | Freq: Two times a day (BID) | ORAL | 0 refills | Status: DC

## 2023-07-09 NOTE — ED Triage Notes (Signed)
 PT reports he fell out side on flat ground yesterday. Pt has RT elbow pain and he is sore on the RT side .

## 2023-07-09 NOTE — Discharge Instructions (Addendum)
 You were seen today for pain in your right shoulder, right sided back pain and low back pain which is most likely caused by a muscle strain from your fall. This can happen from falls, lifting, twisting or overuse. The pain may feel sore, tight, or achy, especially with certain movements.Take the medications prescribed to you as directed. Do not take any over-the-counter aspirin, Motrin, ibuprofen, or Aleve while you are on the prescribed medications.   To help with the pain and inflammation, alternate between using ice and heat to the areas of pain several times a day. Always use a towel between the ice and your skin--do not place ice directly on your skin. Gentle stretching and light walking can be helpful as you begin to feel better. Give your body time to rest and recover. If your pain gets worse, spreads down your legs, or you have trouble walking or controlling your bladder or bowels, go the ER immediately. But if your pain doesn't't improve, follow up with your healthcare provider or orthopedics.

## 2023-07-09 NOTE — ED Provider Notes (Signed)
 [facility]    CSN: [id number] Arrival date & time: 07/09/23  1308      History   Chief Complaint Chief Complaint  Patient presents with   Fall    HPI Bryce Kane is a 73 y.o. male.   Bryce Kane is a 73 y.o. male that  complains of pain in the right shoulder pain and  pain to the right thoracic region after a fall 1 day ago.  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 He denies hitting his head or LOC.No chest pain, shortness of breath, dizziness or other symptoms prior to or after the fall. Patient reports pain to the posterior shoulder that radiates to the right side of the neck. The pain is constant and worse with any arm movement.  Movement of the arm causes an intense sharp painful sensation.  Pain at its worst is 10 /10. No neck pain. No numbness, tingling, right upper extremity weakness, loss of sensation or loss of motion.  He denies any history of previous shoulder injury.  He tried IcyHot and a heating pad last night.  He has not tried anything orally for the pain.  The following portions of the patient's history were reviewed and updated as appropriate: allergies, current medications, past family history, past medical history, past social history, past surgical history, and problem list.      Past Medical History:  Diagnosis Date   Hypertension     There are no active problems to display for this patient.   Past Surgical History:  Procedure Laterality Date   HERNIA REPAIR         Home Medications    Prior to Admission medications   Medication Sig Start Date End Date Taking? Authorizing Provider  cyclobenzaprine (FLEXERIL) 10 MG tablet Take 1 tablet (10 mg total) by mouth at bedtime. 07/09/23  Yes Lurline Idol, FNP  methocarbamol (ROBAXIN) 500 MG tablet Take 1 tablet (500 mg total) by mouth every morning. 07/09/23  Yes Lurline Idol, FNP  naproxen (NAPROSYN) 500 MG tablet Take 1 tablet (500  mg total) by mouth 2 (two) times daily with a meal. 07/09/23  Yes Lurline Idol, FNP  pantoprazole (PROTONIX) 20 MG tablet Take 1 tablet (20 mg total) by mouth daily. 02/20/23   Doreene Eland, MD    Family History Family History  Family history unknown: Yes    Social History Social History   Tobacco Use   Smoking status: Never   Smokeless tobacco: Never  Vaping Use   Vaping status: Never Used  Substance Use Topics   Alcohol use: No   Drug use: No     Allergies   Patient has no known allergies.   Review of Systems Review of Systems  Respiratory:  Negative for shortness of breath.   Cardiovascular:  Negative for chest pain, palpitations and leg swelling.  Gastrointestinal:  Negative for nausea and vomiting.  Musculoskeletal:  Positive for arthralgias and myalgias. Negative for gait problem and joint swelling.  Skin:  Negative for wound.  Neurological:  Negative for dizziness, weakness, light-headedness and headaches.  All other systems reviewed and are negative.    Physical Exam Triage Vital Signs ED Triage Vitals  Encounter Vitals Group     BP 07/09/23 0824 (!) 145/92     Systolic BP Percentile --      Diastolic BP Percentile --      Pulse Rate 07/09/23 0824 79  Resp 07/09/23 0824 18     Temp 07/09/23 0824 (!) 97.4 F (36.3 C)     Temp src --      SpO2 07/09/23 0824 97 %     Weight --      Height --      Head Circumference --      Peak Flow --      Pain Score 07/09/23 0822 10     Pain Loc --      Pain Education --      Exclude from Growth Chart --    No data found.  Updated Vital Signs BP (!) 145/92   Pulse 79   Temp (!) 97.4 F (36.3 C)   Resp 18   SpO2 97%   Visual Acuity Right Eye Distance:   Left Eye Distance:   Bilateral Distance:    Right Eye Near:   Left Eye Near:    Bilateral Near:     Physical Exam Vitals reviewed.  Constitutional:      General: He is awake. He is not in acute distress.    Appearance: Normal  appearance. He is well-developed, well-groomed and normal weight. He is not ill-appearing, toxic-appearing or diaphoretic.  HENT:     Head: Normocephalic.     Ears:     Comments: Patient is very hard of hearing     Nose: Nose normal.     Mouth/Throat:     Mouth: Mucous membranes are moist.  Eyes:     Pupils: Pupils are equal, round, and reactive to light.  Neck:     Trachea: Trachea normal.  Cardiovascular:     Rate and Rhythm: Normal rate and regular rhythm.     Heart sounds: Normal heart sounds.  Pulmonary:     Effort: Pulmonary effort is normal.     Breath sounds: Normal breath sounds.  Chest:     Chest wall: No tenderness.  Musculoskeletal:        General: Normal range of motion.     Right shoulder: Tenderness present. No swelling, deformity, effusion, laceration, bony tenderness or crepitus. Normal range of motion. Normal strength.     Cervical back: Normal range of motion and neck supple. No tenderness. Pain with movement and muscular tenderness present. No spinous process tenderness.     Thoracic back: No swelling, deformity, lacerations, spasms or bony tenderness. Normal range of motion.     Lumbar back: Tenderness and bony tenderness present. No swelling or spasms. Normal range of motion. Negative right straight leg raise test and negative left straight leg raise test.  Skin:    General: Skin is warm and dry.  Neurological:     General: No focal deficit present.     Mental Status: He is alert and oriented to person, place, and time.  Psychiatric:        Behavior: Behavior is cooperative.      UC Treatments / Results  Labs (all labs ordered are listed, but only abnormal results are displayed) Labs Reviewed - No data to display  EKG   Radiology DG Shoulder Right Result Date: 07/09/2023 CLINICAL DATA:  pain to the posterior aspect of right shoulder s/p fall 1 day ago EXAM: RIGHT SHOULDER - 2+ VIEW COMPARISON:  March 25, 2021 FINDINGS: No acute fracture or  dislocation. Mild degenerative changes of the [facility] joint. soft tissues are unremarkable. IMPRESSION: No acute fracture or dislocation. Electronically Signed   By: Wallie Char M.D.   On: 07/09/2023 09:05  Procedures Procedures (including critical care time)  Medications Ordered in UC Medications - No data to display  Initial Impression / Assessment and Plan / UC Course  I have reviewed the triage vital signs and the nursing notes.  Pertinent labs & imaging results that were available during my care of the patient were reviewed by me and considered in my medical decision making (see chart for details).    73 year old male in overall excellent health with a history only significant for acid reflux managed with Protonix presents with right shoulder and right thoracic pain following a fall at work yesterday. He denies head trauma, loss of consciousness, chest pain, shortness of breath, dizziness, or any neurological symptoms before or after the incident. There is no reported numbness, tingling, weakness, or sensory loss in the right upper extremity, and no prior history of shoulder injury. He is alert, oriented, and in no acute distress. Physical exam reveals full range of motion of the right upper extremity, without any limitation, and no focal neurological deficits. Right shoulder X-ray is negative for acute fracture or other abnormalities. Treatment initiated with Naproxen, Flexeril, and Robaxin. Supportive care measures were discussed, including rest, ice, and activity modification. Advised to follow up with orthopedics if symptoms fail to improve or recur after initial improvement.  Today's evaluation has revealed no signs of a dangerous process. Discussed diagnosis with patient and/or guardian. Patient and/or guardian aware of their diagnosis, possible red flag symptoms to watch out for and need for close follow up. Patient and/or guardian understands verbal and written discharge instructions.  Patient and/or guardian comfortable with plan and disposition.  Patient and/or guardian has a clear mental status at this time, good insight into illness (after discussion and teaching) and has clear judgment to make decisions regarding their care  Documentation was completed with the aid of voice recognition software. Transcription may contain typographical errors. Final Clinical Impressions(s) / UC Diagnoses   Final diagnoses:  Acute pain of right shoulder  Acute right-sided thoracic back pain  Chronic bilateral low back pain without sciatica  Fall, initial encounter     Discharge Instructions      You were seen today for pain in your right shoulder, right sided back pain and low back pain which is most likely caused by a muscle strain from your fall. This can happen from falls, lifting, twisting or overuse. The pain may feel sore, tight, or achy, especially with certain movements.Take the medications prescribed to you as directed. Do not take any over-the-counter aspirin, Motrin, ibuprofen, or Aleve while you are on the prescribed medications.   To help with the pain and inflammation, alternate between using ice and heat to the areas of pain several times a day. Always use a towel between the ice and your skin--do not place ice directly on your skin. Gentle stretching and light walking can be helpful as you begin to feel better. Give your body time to rest and recover. If your pain gets worse, spreads down your legs, or you have trouble walking or controlling your bladder or bowels, go the ER immediately. But if your pain doesn't't improve, follow up with your healthcare provider or orthopedics.      ED Prescriptions     Medication Sig Dispense Auth. Provider   naproxen (NAPROSYN) 500 MG tablet Take 1 tablet (500 mg total) by mouth 2 (two) times daily with a meal. 20 tablet Lurline Idol, FNP   methocarbamol (ROBAXIN) 500 MG tablet Take 1 tablet (500 mg total)  by mouth every  morning. 10 tablet Lurline Idol, FNP   cyclobenzaprine (FLEXERIL) 10 MG tablet Take 1 tablet (10 mg total) by mouth at bedtime. 10 tablet Lurline Idol, FNP      PDMP not reviewed this encounter.   Cathlean Sauer [place], [place] 07/09/23 [phone]

## 2023-07-15 ENCOUNTER — Emergency Department (HOSPITAL_COMMUNITY)
Admission: EM | Admit: 2023-07-15 | Discharge: 2023-07-15 | Disposition: A | Discharge status: Home / Self Care | Attending: Emergency Medicine | Admitting: Emergency Medicine

## 2023-07-15 ENCOUNTER — Other Ambulatory Visit: Payer: Self-pay

## 2023-07-15 ENCOUNTER — Emergency Department (HOSPITAL_COMMUNITY)

## 2023-07-15 ENCOUNTER — Encounter (HOSPITAL_COMMUNITY): Payer: Self-pay

## 2023-07-15 DIAGNOSIS — I10 Essential (primary) hypertension: Secondary | ICD-10-CM | POA: Diagnosis not present

## 2023-07-15 DIAGNOSIS — W19XXXA Unspecified fall, initial encounter: Secondary | ICD-10-CM | POA: Diagnosis not present

## 2023-07-15 DIAGNOSIS — R0789 Other chest pain: Secondary | ICD-10-CM | POA: Diagnosis present

## 2023-07-15 LAB — BASIC METABOLIC PANEL WITH GFR
Anion gap: 8 (ref 5–15)
BUN: 14 mg/dL (ref 8–23)
CO2: 26 mmol/L (ref 22–32)
Calcium: 9.1 mg/dL (ref 8.9–10.3)
Chloride: 108 mmol/L (ref 98–111)
Creatinine, Ser: 0.99 mg/dL (ref 0.61–1.24)
GFR, Estimated: >60 mL/min (ref >60)
Glucose, Bld: 114 mg/dL — ABNORMAL HIGH (ref 70–99)
Potassium: 4 mmol/L (ref 3.5–5.1)
Sodium: 142 mmol/L (ref 135–145)

## 2023-07-15 LAB — CBC
HCT: 45.1 % (ref 39.0–52.0)
Hemoglobin: 15.1 g/dL (ref 13.0–17.0)
MCH: 29.5 pg (ref 26.0–34.0)
MCHC: 33.5 g/dL (ref 30.0–36.0)
MCV: 88.3 fL (ref 80.0–100.0)
Platelets: 230 10*3/uL (ref 150–400)
RBC: 5.11 MIL/uL (ref 4.22–5.81)
RDW: 13.5 % (ref 11.5–15.5)
WBC: 4.6 10*3/uL (ref 4.0–10.5)
nRBC: 0 % (ref 0.0–0.2)

## 2023-07-15 LAB — TROPONIN I (HIGH SENSITIVITY): Troponin I (High Sensitivity): 15 ng/L (ref <18)

## 2023-07-15 NOTE — ED Triage Notes (Signed)
 Pov report falling on Monday, hitting right side of the chest "still hurts". 10 out 10, worse when trying to get out of bed.  PT reports dragging left leg since "long time".  Pt aox4 denies SOB

## 2023-07-15 NOTE — ED Provider Notes (Signed)
 [place] EMERGENCY DEPARTMENT AT [facility] Provider Note  CSN: [id number] Arrival date & time: 07/15/23 0456  Chief Complaint(s) Chest Pain  HPI Bryce Kane is a 73 y.o. male here for 1 to 2 weeks of right-sided intermittent chest discomfort mostly felt while lying down and changing positions.  The pain became worse after a mechanical fall 1 week ago.  Pain did improve with Tylenol earlier today.  Denies any shortness of breath.  No coughing or congestion.  No substernal chest discomfort.  No neck pain.  No other physical complaints.  Currently patient denies any pain.  When asked to change positions in bed, this reproduced the right lateral chest wall/rib pain.  The history is provided by the patient.    Past Medical History Past Medical History:  Diagnosis Date   Hypertension    There are no active problems to display for this patient.  Home Medication(s) Prior to Admission medications   Medication Sig Start Date End Date Taking? Authorizing Provider  acetaminophen (TYLENOL) 650 MG CR tablet Take 650 mg by mouth every 8 (eight) hours as needed for pain.   Yes [provider]  cyclobenzaprine (FLEXERIL) 10 MG tablet Take 1 tablet (10 mg total) by mouth at bedtime. Patient not taking: Reported on 07/15/2023 07/09/23   Lurline Idol, FNP  methocarbamol (ROBAXIN) 500 MG tablet Take 1 tablet (500 mg total) by mouth every morning. Patient not taking: Reported on 07/15/2023 07/09/23   Lurline Idol, FNP  naproxen (NAPROSYN) 500 MG tablet Take 1 tablet (500 mg total) by mouth 2 (two) times daily with a meal. Patient not taking: Reported on 07/15/2023 07/09/23   Lurline Idol, FNP  pantoprazole (PROTONIX) 20 MG tablet Take 1 tablet (20 mg total) by mouth daily. Patient not taking: Reported on 07/15/2023 02/20/23   Doreene Eland, MD                                                                                                                                     Allergies Patient has no known allergies.  Review of Systems Review of Systems As noted in HPI  Physical Exam Vital Signs  I have reviewed the triage vital signs BP (!) 163/103   Pulse 87   Temp 97.8 F (36.6 C)   Resp 16   Ht 5\' 10"  (1.778 m)   Wt 86.2 kg   SpO2 97%   BMI 27.26 kg/m   Physical Exam Vitals reviewed.  Constitutional:      General: He is not in acute distress.    Appearance: He is well-developed. He is not diaphoretic.  HENT:     Head: Normocephalic and atraumatic.     Nose: Nose normal.  Eyes:     General: No scleral icterus.       Right eye: No discharge.        Left eye: No discharge.  Conjunctiva/sclera: Conjunctivae normal.     Pupils: Pupils are equal, round, and reactive to light.  Cardiovascular:     Rate and Rhythm: Normal rate and regular rhythm.     Heart sounds: No murmur heard.    No friction rub. No gallop.  Pulmonary:     Effort: Pulmonary effort is normal. No respiratory distress.     Breath sounds: Normal breath sounds. No stridor. No rales.  Chest:     Chest wall: Tenderness present.    Abdominal:     General: There is no distension.     Palpations: Abdomen is soft.     Tenderness: There is no abdominal tenderness.  Musculoskeletal:        General: No tenderness.     Cervical back: Normal range of motion and neck supple.  Skin:    General: Skin is warm and dry.     Findings: No erythema or rash.  Neurological:     Mental Status: He is alert and oriented to person, place, and time.     ED Results and Treatments Labs (all labs ordered are listed, but only abnormal results are displayed) Labs Reviewed  BASIC METABOLIC PANEL WITH GFR - Abnormal; Notable for the following components:      Result Value   Glucose, Bld 114 (*)    All other components within normal limits  CBC  TROPONIN I (HIGH SENSITIVITY)  TROPONIN I (HIGH SENSITIVITY)                                                                                                                          EKG  EKG Interpretation Date/Time:  Thursday July 15 2023 05:05:15 EDT Ventricular Rate:  94 PR Interval:  155 QRS Duration:  106 QT Interval:  384 QTC Calculation: 481 R Axis:   32  Text Interpretation: Sinus rhythm Borderline prolonged QT interval Confirmed by Townsend Freud [phone]) on 07/15/2023 5:22:30 AM       Radiology DG Ribs Unilateral Right Result Date: 07/15/2023 CLINICAL DATA:  Fall 1 week ago with persistent right-sided chest and rib pain. EXAM: RIGHT RIBS - 2 VIEW COMPARISON:  Chest x-ray 03/25/2010 FINDINGS: Oblique views of the right ribs show no evidence for an acute displaced right-sided rib fracture. No evidence for pneumothorax. No right pleural effusion (left lung base not included on this right rib study). Telemetry leads overlie the chest. IMPRESSION: No evidence for acute displaced right-sided rib fracture. Electronically Signed   By: Donnal Fusi M.D.   On: 07/15/2023 06:20   DG Chest 2 View Result Date: 07/15/2023 CLINICAL DATA:  Right chest pain.  Fell 1 week ago. EXAM: CHEST - 2 VIEW COMPARISON:  PA and lateral chest 03/25/2010. FINDINGS: There is linear atelectasis in both lung bases and a relatively low inspiration. There is a subtle asymmetric left lower lobe opacity which could be asymmetric atelectasis or a developing pneumonia. A follow-up study is recommended in full inspiration. The remaining lungs are  clear. Mild cardiomegaly. No vascular congestion is seen. The mediastinum is normally outlined. There is thoracic spondylosis, slight kyphodextroscoliosis. IMPRESSION: 1. Subtle asymmetric left lower lobe opacity which could be asymmetric atelectasis or a developing pneumonia. A follow-up study is recommended in full inspiration. 2. Mild cardiomegaly. Electronically Signed   By: Denman Fischer M.D.   On: 07/15/2023 05:35    Medications Ordered in ED Medications - No data to  display Procedures Procedures  (including critical care time) Medical Decision Making / ED Course   Medical Decision Making Amount and/or Complexity of Data Reviewed Labs: ordered. Radiology: ordered.    Right lateral chest wall pain.  Reproducible with range of motion.  Favoring MSK.  During most recent evaluation, x-ray of the right ribs were not obtained  Chest x-ray of the right ribs here without obvious fractures.  No evidence of pneumonia, pneumothorax, pulmonary edema or pleural effusions.  Without cough or fever, slight opacity likely atelectasis.  Doubt pneumonia.  Rest of the labs and workup was reassuring.  Unlikely ACS and EKG without acute ischemic changes or evidence of pericarditis.  Troponin drawn in triage was negative.  No need for repeat in this clinical picture.  Presentation not classic for dissection or esophageal perforation.  Doubt pulmonary embolism.     Final Clinical Impression(s) / ED Diagnoses Final diagnoses:  Chest wall pain   The patient appears reasonably screened and/or stabilized for discharge and I doubt any other medical condition or other [facility] requiring further screening, evaluation, or treatment in the ED at this time. I have discussed the findings, Dx and Tx plan with the patient/family who expressed understanding and agree(s) with the plan. Discharge instructions discussed at length. The patient/family was given strict return precautions who verbalized understanding of the instructions. No further questions at time of discharge.  Disposition: Discharge  Condition: Good  ED Discharge Orders     None        Follow Up: Primary care provider  Call  to schedule an appointment for close follow up    This chart was dictated using voice recognition software.  Despite best efforts to proofread,  errors can occur which can change the documentation meaning.    Lindle Rhea, MD 07/15/23 [phone]

## 2023-07-15 NOTE — Discharge Instructions (Signed)
 For pain control you may take 1000 mg of acetaminophen (Tylenol) every 8 hours and/or 600 mg of Ibuprofen (Motrin, Advil, etc.) every 6-8 hours as needed.  Please limit acetaminophen (Tylenol) to 4000 mg and Ibuprofen (Motrin, Advil, etc.) to 2400 mg for a 24hr period. Please note that other over-the-counter medicine may contain acetaminophen or ibuprofen as a component of their ingredients.

## 2023-12-18 ENCOUNTER — Ambulatory Visit (HOSPITAL_COMMUNITY)
Admission: EM | Admit: 2023-12-18 | Discharge: 2023-12-18 | Disposition: A | Discharge status: Home / Self Care | Attending: Family Medicine | Admitting: Family Medicine

## 2023-12-18 ENCOUNTER — Encounter (HOSPITAL_COMMUNITY): Payer: Self-pay

## 2023-12-18 DIAGNOSIS — G5622 Lesion of ulnar nerve, left upper limb: Secondary | ICD-10-CM

## 2023-12-18 DIAGNOSIS — R202 Paresthesia of skin: Secondary | ICD-10-CM | POA: Diagnosis not present

## 2023-12-18 HISTORY — DX: Unspecified hearing loss, unspecified ear: H91.90

## 2023-12-18 MED ORDER — DEXAMETHASONE SODIUM PHOSPHATE 10 MG/ML IJ SOLN
10.0000 mg | Freq: Once | INTRAMUSCULAR | Status: AC
  Administered 2023-12-18: 10 mg via INTRAMUSCULAR

## 2023-12-18 MED ORDER — DEXAMETHASONE SODIUM PHOSPHATE 10 MG/ML IJ SOLN
INTRAMUSCULAR | Status: AC
  Filled 2023-12-18: qty 1

## 2023-12-18 NOTE — ED Triage Notes (Addendum)
 Patient reports that he has had right hand numbness since around 0900 today, but states, I'm not really sure. Patient denies numbness to the right arm. Patient states he is dropping things when he picks them up with his right hand. Patient has bilateral strong hand grips.  Patient clarified that he was having numbness of the 3rd,, 4th, and 5th finger.

## 2023-12-18 NOTE — Discharge Instructions (Addendum)
 Meds ordered this encounter  Medications   dexamethasone (DECADRON) injection 10 mg

## 2023-12-22 NOTE — ED Provider Notes (Signed)
  [facility]   [id number] 12/18/23 Arrival Time: 1412  ASSESSMENT & PLAN:  1. Left hand paresthesia   2. Entrapment of left ulnar nerve    Trial of: Meds ordered this encounter  Medications   dexamethasone  (DECADRON ) injection 10 mg   Activities as tolerated.  Recommend:  Follow-up Information     [facility] at [facility].   Specialty: Urgent Care Why: If worsening or failing to improve as anticipated. Contact information: [address] [place]  [address] [phone]                Reviewed expectations re: course of current medical issues. Questions answered. Outlined signs and symptoms indicating need for more acute intervention. Patient verbalized understanding. After Visit Summary given.  SUBJECTIVE: History from: patient. Bryce Kane is a 73 y.o. male who reports intermittent right hand numbness/tingling since around 0900 today, but states, I'm not really sure. Just 3/4/5 fingers on hand. Denies extremity weakness.   Past Surgical History:  Procedure Laterality Date   HERNIA REPAIR        OBJECTIVE:  Vitals:   12/18/23 1422  BP: (!) 144/82  Pulse: 94  Resp: 16  Temp: 98.3 F (36.8 C)  TempSrc: Oral  SpO2: 99%    General appearance: alert; no distress HEENT: [place]; AT Neck: supple with FROM Resp: unlabored respirations Extremities: RUE: warm with well perfused appearance; reports slight decrease in sensation over ulnar nerve distribution of R hand; normal grip CV: brisk extremity capillary refill of RUE; 2+ radial pulse of RUE. Skin: warm and dry; no visible rashes Neurologic: gait normal; normal sensation and strength of RUE Psychological: alert and cooperative; normal mood and affect  Imaging: No results found.    No Known Allergies  Past Medical History:  Diagnosis Date   HOH (hard of hearing)    Hypertension    Social History   Socioeconomic History   Marital status: Married     Spouse name: Not on file   Number of children: Not on file   Years of education: Not on file   Highest education level: Not on file  Occupational History   Not on file  Tobacco Use   Smoking status: Never   Smokeless tobacco: Never  Vaping Use   Vaping status: Never Used  Substance and Sexual Activity   Alcohol use: No   Drug use: No   Sexual activity: Not Currently  Other Topics Concern   Not on file  Social History Narrative   Not on file   Social Drivers of Health   Financial Resource Strain: Not on file  Food Insecurity: Not on file  Transportation Needs: Not on file  Physical Activity: Not on file  Stress: Not on file  Social Connections: Not on file   Family History  Family history unknown: Yes   Past Surgical History:  Procedure Laterality Date   HERNIA REPAIR         Rolinda Rogue, MD 12/22/23 [phone]

## 2023-12-24 ENCOUNTER — Ambulatory Visit (HOSPITAL_COMMUNITY)
Admission: EM | Admit: 2023-12-24 | Discharge: 2023-12-24 | Disposition: A | Discharge status: Home / Self Care | Attending: Family Medicine | Admitting: Family Medicine

## 2023-12-24 ENCOUNTER — Encounter (HOSPITAL_COMMUNITY): Payer: Self-pay

## 2023-12-24 DIAGNOSIS — R202 Paresthesia of skin: Secondary | ICD-10-CM | POA: Diagnosis not present

## 2023-12-24 DIAGNOSIS — M545 Low back pain, unspecified: Secondary | ICD-10-CM

## 2023-12-24 MED ORDER — METHYLPREDNISOLONE 4 MG PO TBPK: ORAL_TABLET | ORAL | 0 refills | Status: AC

## 2023-12-24 NOTE — Discharge Instructions (Addendum)
 Start Medrol  Dosepak as prescribed.  You may do heat to the low back as needed.  Lots of rest.  Please follow-up with your PCP at your scheduled appointment in November.  Please go to the ER for any worsening symptoms or returning symptoms that occur prior to seeing your PCP.  I hope you feel better soon!

## 2023-12-24 NOTE — ED Provider Notes (Signed)
 [facility]    CSN: [id number] Arrival date & time: 12/24/23  0801      History   Chief Complaint No chief complaint on file.   HPI Bryce Kane is a 73 y.o. male presents for low back pain and right hand numbness and tingling.  Patient was seen in urgent care 9/20 for right hand numbness and tingling to his 3rd, 4th and 5th fingers that began that day.  No known history of carpal tunnel.  He was diagnosed with entrapment of right ulnar nerve and was given a dexamethasone  shot in his left buttock.  Reports everything was fine until yesterday when he began having more numbness and tingling of his right 3rd, 4th and 5th fingers as well as some left lower back pain.  States his left buttock is also sore.  No numbness/tingling/weakness of his lower extremities, no bowel or bladder incontinence, no saddle paresthesia.  He does not have a PCP.  He has not taken any OTC medications for symptoms.  No other concerns at this time.  HPI  Past Medical History:  Diagnosis Date   HOH (hard of hearing)    Hypertension     There are no active problems to display for this patient.   Past Surgical History:  Procedure Laterality Date   HERNIA REPAIR         Home Medications    Prior to Admission medications   Medication Sig Start Date End Date Taking? Authorizing Provider  acetaminophen  (TYLENOL ) 650 MG CR tablet Take 650 mg by mouth every 8 (eight) hours as needed for pain.   Yes [provider]  methylPREDNISolone  (MEDROL  DOSEPAK) 4 MG TBPK tablet Take as prescribed on package 12/24/23  Yes Loreda Myla SAUNDERS, NP    Family History Family History  Family history unknown: Yes    Social History Social History   Tobacco Use   Smoking status: Never   Smokeless tobacco: Never  Vaping Use   Vaping status: Never Used  Substance Use Topics   Alcohol use: No   Drug use: No     Allergies   Patient has no known allergies.   Review of Systems Review of Systems   Musculoskeletal:  Positive for back pain.       Paresthesias of the right 3rd, 4th and 5th fingers     Physical Exam Triage Vital Signs ED Triage Vitals  Encounter Vitals Group     BP 12/24/23 0813 (!) 158/108     Girls Systolic BP Percentile --      Girls Diastolic BP Percentile --      Boys Systolic BP Percentile --      Boys Diastolic BP Percentile --      Pulse Rate 12/24/23 0813 94     Resp 12/24/23 0813 20     Temp 12/24/23 0813 98.1 F (36.7 C)     Temp Source 12/24/23 0813 Oral     SpO2 12/24/23 0813 97 %     Weight --      Height --      Head Circumference --      Peak Flow --      Pain Score 12/24/23 0814 8     Pain Loc --      Pain Education --      Exclude from Growth Chart --    No data found.  Updated Vital Signs BP (!) 148/83 (BP Location: Left Arm)   Pulse 94   Temp 98.1  F (36.7 C) (Oral)   Resp 20   SpO2 97%   Visual Acuity Right Eye Distance:   Left Eye Distance:   Bilateral Distance:    Right Eye Near:   Left Eye Near:    Bilateral Near:     Physical Exam Vitals and nursing note reviewed.  Constitutional:      General: He is not in acute distress.    Appearance: Normal appearance. He is not ill-appearing.  HENT:     Head: Normocephalic and atraumatic.  Eyes:     Pupils: Pupils are equal, round, and reactive to light.  Cardiovascular:     Rate and Rhythm: Normal rate.  Pulmonary:     Effort: Pulmonary effort is normal.  Musculoskeletal:     Right wrist: No swelling, deformity, effusion, lacerations, tenderness, bony tenderness or snuff box tenderness. Normal range of motion. Normal pulse.     Lumbar back: Tenderness present. No swelling, edema, deformity, signs of trauma, lacerations, spasms or bony tenderness. Normal range of motion. Negative right straight leg raise test and negative left straight leg raise test. No scoliosis.       Back:     Comments: Negative Fallon and tinel test.  Strength 5 out of 5 bilateral upper and  lower extremities.  There is no erythema, warmth, induration fluctuance or hematoma of the left buttock.  Skin:    General: Skin is warm and dry.  Neurological:     General: No focal deficit present.     Mental Status: He is alert and oriented to person, place, and time.  Psychiatric:        Mood and Affect: Mood normal.        Behavior: Behavior normal.      UC Treatments / Results  Labs (all labs ordered are listed, but only abnormal results are displayed) Labs Reviewed - No data to display  Basic metabolic panel Order: [id number]  Status: Final result     Next appt: 02/04/2024 at 02:00 PM in Family Medicine ([facility] [place], [place])   Test Result Released: No (inaccessible in MyChart)   0 Result Notes        Component Ref Range & Units (hover) 5 mo ago 4 yr ago 5 yr ago 9 yr ago 13 yr ago  Sodium 142 144 R 146 High  142 139 R  Potassium 4.0 4.5 R 4.2 3.3 Low  4.1 R  Chloride 108 105 R 110 109 R 103 R  CO2 26 24 R 27 27   Glucose, Bld 114 High  105 High  R 103 High  137 High  R 109 High   Comment: Glucose reference range applies only to samples taken after fasting for at least 8 hours.  BUN 14 14 R 17 19 R 24 High  R  Creatinine, Ser 0.99 1.20 R 1.02 1.12 1.2 R  Calcium 9.1 9.4 R 9.3 8.9   GFR, Estimated >60      Comment: (NOTE) Calculated using the CKD-EPI Creatinine Equation (2021)  Anion gap 8  9 CM 6   Comment: Performed at [facility], [address] [place], [place] [address]  Resulting Agency [facility] CLIN LAB [facility] CLIN LAB [facility] CLIN LAB [facility] CLIN LAB        Specimen Collected: 07/15/23 05:14 Last Resulted: 07/15/23 05:48    EKG   Radiology No results found.  Procedures Procedures (including critical care time)  Medications Ordered in UC Medications - No data  to display  Initial Impression / Assessment and Plan / UC Course  I have reviewed the triage vital signs and the nursing notes.  Pertinent labs & imaging results that  were available during my care of the patient were reviewed by me and considered in my medical decision making (see chart for details).     Reviewed exam and symptoms with patient.  Blood pressure improved on recheck.  Discussed musculoskeletal cause of low back pain.  Patient with returned paresthesias of right 3rd, 4th and 5th fingers.  Will do Medrol  Dosepak to address both these concerns and nursing staff was able to set patient up with a PCP as he does not have one.  I did discuss that he will need to follow-up with his PCP for further evaluation of the paresthesias as a steroids will be a temporary treatment.  May do heat to the low back lots of rest.  ER precautions reviewed and patient verbalizes understanding. Final Clinical Impressions(s) / UC Diagnoses   Final diagnoses:  Paresthesias in right hand  Acute left-sided low back pain without sciatica     Discharge Instructions      Start Medrol  Dosepak as prescribed.  You may do heat to the low back as needed.  Lots of rest.  Please follow-up with your PCP at your scheduled appointment in November.  Please go to the ER for any worsening symptoms or returning symptoms that occur prior to seeing your PCP.  I hope you feel better soon!     ED Prescriptions     Medication Sig Dispense Auth. Provider   methylPREDNISolone  (MEDROL  DOSEPAK) 4 MG TBPK tablet Take as prescribed on package 21 tablet [place] Fontanilla, Jodi R, NP      PDMP not reviewed this encounter.   Loreda Myla SAUNDERS, NP 12/24/23 [phone]

## 2023-12-24 NOTE — ED Triage Notes (Signed)
 Patient reports right side buttocks pain for his injection he received here on 12/18/2023. Patient reports his butt is sore.

## 2024-02-04 ENCOUNTER — Ambulatory Visit
